# Patient Record
Sex: Female | Born: 1960 | Race: White | Hispanic: No | Marital: Married
Health system: Southern US, Community
[De-identification: ages and names within clinical notes are randomized; demographics above are authoritative.]

---

## 1997-12-12 ENCOUNTER — Ambulatory Visit (HOSPITAL_COMMUNITY): Admission: RE | Admit: 1997-12-12 | Discharge: 1997-12-12 | Payer: Self-pay | Admitting: Obstetrics & Gynecology

## 1997-12-14 ENCOUNTER — Ambulatory Visit (HOSPITAL_COMMUNITY): Admission: RE | Admit: 1997-12-14 | Discharge: 1997-12-14 | Payer: Self-pay | Admitting: Obstetrics & Gynecology

## 1998-05-08 ENCOUNTER — Encounter: Admission: RE | Admit: 1998-05-08 | Discharge: 1998-05-08 | Payer: Self-pay | Admitting: *Deleted

## 1999-03-20 ENCOUNTER — Other Ambulatory Visit: Admission: RE | Admit: 1999-03-20 | Discharge: 1999-03-20 | Payer: Self-pay | Admitting: Obstetrics & Gynecology

## 2000-11-12 ENCOUNTER — Other Ambulatory Visit: Admission: RE | Admit: 2000-11-12 | Discharge: 2000-11-12 | Payer: Self-pay | Admitting: Obstetrics & Gynecology

## 2000-11-19 ENCOUNTER — Encounter: Payer: Self-pay | Admitting: Obstetrics & Gynecology

## 2000-11-19 ENCOUNTER — Encounter: Admission: RE | Admit: 2000-11-19 | Discharge: 2000-11-19 | Payer: Self-pay | Admitting: Obstetrics & Gynecology

## 2014-05-11 ENCOUNTER — Institutional Professional Consult (permissible substitution): Payer: Self-pay | Admitting: Cardiology

## 2018-01-01 ENCOUNTER — Inpatient Hospital Stay: Payer: Self-pay

## 2018-01-01 ENCOUNTER — Inpatient Hospital Stay (HOSPITAL_COMMUNITY)
Admission: EM | Admit: 2018-01-01 | Discharge: 2018-01-27 | DRG: 082 | Disposition: E | Payer: BC Managed Care – PPO | Attending: General Surgery | Admitting: General Surgery

## 2018-01-01 ENCOUNTER — Inpatient Hospital Stay (HOSPITAL_COMMUNITY): Payer: BC Managed Care – PPO

## 2018-01-01 ENCOUNTER — Emergency Department (HOSPITAL_COMMUNITY): Payer: BC Managed Care – PPO

## 2018-01-01 ENCOUNTER — Other Ambulatory Visit: Payer: Self-pay

## 2018-01-01 DIAGNOSIS — D649 Anemia, unspecified: Secondary | ICD-10-CM | POA: Diagnosis present

## 2018-01-01 DIAGNOSIS — G935 Compression of brain: Secondary | ICD-10-CM | POA: Diagnosis present

## 2018-01-01 DIAGNOSIS — R001 Bradycardia, unspecified: Secondary | ICD-10-CM | POA: Diagnosis present

## 2018-01-01 DIAGNOSIS — J9601 Acute respiratory failure with hypoxia: Secondary | ICD-10-CM | POA: Diagnosis present

## 2018-01-01 DIAGNOSIS — E876 Hypokalemia: Secondary | ICD-10-CM | POA: Diagnosis present

## 2018-01-01 DIAGNOSIS — G9382 Brain death: Secondary | ICD-10-CM | POA: Diagnosis not present

## 2018-01-01 DIAGNOSIS — S0219XA Other fracture of base of skull, initial encounter for closed fracture: Secondary | ICD-10-CM | POA: Diagnosis present

## 2018-01-01 DIAGNOSIS — R402323 Coma scale, best motor response, extension, at hospital admission: Secondary | ICD-10-CM | POA: Diagnosis present

## 2018-01-01 DIAGNOSIS — R402213 Coma scale, best verbal response, none, at hospital admission: Secondary | ICD-10-CM | POA: Diagnosis present

## 2018-01-01 DIAGNOSIS — S065X9A Traumatic subdural hemorrhage with loss of consciousness of unspecified duration, initial encounter: Secondary | ICD-10-CM | POA: Diagnosis present

## 2018-01-01 DIAGNOSIS — I609 Nontraumatic subarachnoid hemorrhage, unspecified: Secondary | ICD-10-CM

## 2018-01-01 DIAGNOSIS — Z66 Do not resuscitate: Secondary | ICD-10-CM

## 2018-01-01 DIAGNOSIS — S065XAA Traumatic subdural hemorrhage with loss of consciousness status unknown, initial encounter: Secondary | ICD-10-CM | POA: Diagnosis present

## 2018-01-01 DIAGNOSIS — S064XAA Epidural hemorrhage with loss of consciousness status unknown, initial encounter: Secondary | ICD-10-CM

## 2018-01-01 DIAGNOSIS — R579 Shock, unspecified: Secondary | ICD-10-CM | POA: Diagnosis present

## 2018-01-01 DIAGNOSIS — R402113 Coma scale, eyes open, never, at hospital admission: Secondary | ICD-10-CM | POA: Diagnosis present

## 2018-01-01 DIAGNOSIS — S064X9A Epidural hemorrhage with loss of consciousness of unspecified duration, initial encounter: Secondary | ICD-10-CM

## 2018-01-01 DIAGNOSIS — Z515 Encounter for palliative care: Secondary | ICD-10-CM | POA: Diagnosis present

## 2018-01-01 DIAGNOSIS — T68XXXA Hypothermia, initial encounter: Secondary | ICD-10-CM | POA: Diagnosis present

## 2018-01-01 DIAGNOSIS — F1020 Alcohol dependence, uncomplicated: Secondary | ICD-10-CM | POA: Diagnosis present

## 2018-01-01 LAB — BASIC METABOLIC PANEL
Anion gap: 15 (ref 5–15)
BUN: 6 mg/dL (ref 6–20)
CO2: 20 mmol/L — ABNORMAL LOW (ref 22–32)
Calcium: 7.8 mg/dL — ABNORMAL LOW (ref 8.9–10.3)
Chloride: 111 mmol/L (ref 101–111)
Creatinine, Ser: 0.72 mg/dL (ref 0.44–1.00)
GFR calc Af Amer: >60 mL/min (ref >60)
GFR calc non Af Amer: >60 mL/min (ref >60)
Glucose, Bld: 114 mg/dL — ABNORMAL HIGH (ref 65–99)
POTASSIUM: 3.6 mmol/L (ref 3.5–5.1)
SODIUM: 146 mmol/L — AB (ref 135–145)

## 2018-01-01 LAB — I-STAT ARTERIAL BLOOD GAS, ED
Acid-base deficit: 20 mmol/L — ABNORMAL HIGH (ref 0.0–2.0)
Bicarbonate: 9.1 mmol/L — ABNORMAL LOW (ref 20.0–28.0)
O2 Saturation: 96 %
PCO2 ART: 29.5 mmHg — AB (ref 32.0–48.0)
PH ART: 7.083 — AB (ref 7.350–7.450)
Patient temperature: 95
TCO2: 10 mmol/L — ABNORMAL LOW (ref 22–32)
pO2, Arterial: 105 mmHg (ref 83.0–108.0)

## 2018-01-01 LAB — COMPREHENSIVE METABOLIC PANEL
ALT: 27 U/L (ref 14–54)
ANION GAP: 17 — AB (ref 5–15)
AST: 46 U/L — AB (ref 15–41)
Albumin: 4.1 g/dL (ref 3.5–5.0)
Alkaline Phosphatase: 94 U/L (ref 38–126)
BUN: 11 mg/dL (ref 6–20)
CHLORIDE: 110 mmol/L (ref 101–111)
CO2: 11 mmol/L — ABNORMAL LOW (ref 22–32)
Calcium: 8.8 mg/dL — ABNORMAL LOW (ref 8.9–10.3)
Creatinine, Ser: 0.58 mg/dL (ref 0.44–1.00)
GFR calc Af Amer: >60 mL/min (ref >60)
Glucose, Bld: 266 mg/dL — ABNORMAL HIGH (ref 65–99)
POTASSIUM: 2.7 mmol/L — AB (ref 3.5–5.1)
Sodium: 138 mmol/L (ref 135–145)
Total Bilirubin: 1.1 mg/dL (ref 0.3–1.2)
Total Protein: 6.7 g/dL (ref 6.5–8.1)

## 2018-01-01 LAB — TRIGLYCERIDES: Triglycerides: 89 mg/dL (ref <150)

## 2018-01-01 LAB — CBC
HEMATOCRIT: 43.2 % (ref 36.0–46.0)
HEMOGLOBIN: 14.6 g/dL (ref 12.0–15.0)
MCH: 32.9 pg (ref 26.0–34.0)
MCHC: 33.8 g/dL (ref 30.0–36.0)
MCV: 97.3 fL (ref 78.0–100.0)
Platelets: 188 10*3/uL (ref 150–400)
RBC: 4.44 MIL/uL (ref 3.87–5.11)
RDW: 13 % (ref 11.5–15.5)
WBC: 22.9 10*3/uL — AB (ref 4.0–10.5)

## 2018-01-01 LAB — ETHANOL: Alcohol, Ethyl (B): 84 mg/dL — ABNORMAL HIGH (ref <10)

## 2018-01-01 LAB — I-STAT CHEM 8, ED
BUN: 10 mg/dL (ref 6–20)
CREATININE: 0.6 mg/dL (ref 0.44–1.00)
Calcium, Ion: 1.08 mmol/L — ABNORMAL LOW (ref 1.15–1.40)
Chloride: 112 mmol/L — ABNORMAL HIGH (ref 101–111)
Glucose, Bld: 280 mg/dL — ABNORMAL HIGH (ref 65–99)
HEMATOCRIT: 44 % (ref 36.0–46.0)
HEMOGLOBIN: 15 g/dL (ref 12.0–15.0)
Potassium: 2.7 mmol/L — CL (ref 3.5–5.1)
Sodium: 142 mmol/L (ref 135–145)
TCO2: 14 mmol/L — AB (ref 22–32)

## 2018-01-01 LAB — URINALYSIS, ROUTINE W REFLEX MICROSCOPIC
Bilirubin Urine: NEGATIVE
Ketones, ur: 20 mg/dL — AB
LEUKOCYTES UA: NEGATIVE
NITRITE: NEGATIVE
Protein, ur: 100 mg/dL — AB
SPECIFIC GRAVITY, URINE: 1.018 (ref 1.005–1.030)
pH: 5 (ref 5.0–8.0)

## 2018-01-01 LAB — SODIUM
Sodium: 143 mmol/L (ref 135–145)
Sodium: 146 mmol/L — ABNORMAL HIGH (ref 135–145)

## 2018-01-01 LAB — I-STAT CG4 LACTIC ACID, ED: Lactic Acid, Venous: 4.43 mmol/L (ref 0.5–1.9)

## 2018-01-01 LAB — MAGNESIUM: MAGNESIUM: 1.9 mg/dL (ref 1.7–2.4)

## 2018-01-01 LAB — SAMPLE TO BLOOD BANK

## 2018-01-01 LAB — PROTIME-INR
INR: 1.45
PROTHROMBIN TIME: 17.6 s — AB (ref 11.4–15.2)

## 2018-01-01 LAB — MRSA PCR SCREENING: MRSA BY PCR: NEGATIVE

## 2018-01-01 MED ORDER — SODIUM CHLORIDE 0.9% FLUSH: 10.0000 mL | INTRAVENOUS | Status: DC | PRN

## 2018-01-01 MED ORDER — SODIUM CHLORIDE 0.9 % IV BOLUS
1000.0000 mL | Freq: Once | INTRAVENOUS | Status: AC
  Administered 2018-01-01: 1000 mL via INTRAVENOUS

## 2018-01-01 MED ORDER — SODIUM CHLORIDE 0.9% FLUSH: 10.0000 mL | Freq: Two times a day (BID) | INTRAVENOUS | Status: DC

## 2018-01-01 MED ORDER — SODIUM CHLORIDE 0.9 % IV SOLN
0.0000 ug/min | INTRAVENOUS | Status: DC
  Administered 2018-01-01 (×3): 120 ug/min via INTRAVENOUS
  Administered 2018-01-01: 100 ug/min via INTRAVENOUS
  Administered 2018-01-01: 20 ug/min via INTRAVENOUS
  Administered 2018-01-01: 120 ug/min via INTRAVENOUS
  Administered 2018-01-01: 200 ug/min via INTRAVENOUS
  Administered 2018-01-02 (×6): 75 ug/min via INTRAVENOUS
  Administered 2018-01-02: 65 ug/min via INTRAVENOUS
  Administered 2018-01-02: 75 ug/min via INTRAVENOUS
  Filled 2018-01-01: qty 1
  Filled 2018-01-01 (×3): qty 10
  Filled 2018-01-01: qty 1
  Filled 2018-01-01: qty 10
  Filled 2018-01-01: qty 1
  Filled 2018-01-01: qty 10
  Filled 2018-01-01: qty 1
  Filled 2018-01-01 (×2): qty 10
  Filled 2018-01-01: qty 1
  Filled 2018-01-01: qty 10
  Filled 2018-01-01: qty 1

## 2018-01-01 MED ORDER — CHLORHEXIDINE GLUCONATE CLOTH 2 % EX PADS: 6.0000 | MEDICATED_PAD | Freq: Every day | CUTANEOUS | Status: DC

## 2018-01-01 MED ORDER — ALBUMIN HUMAN 5 % IV SOLN
25.0000 g | Freq: Once | INTRAVENOUS | Status: AC
  Administered 2018-01-01: 25 g via INTRAVENOUS
  Filled 2018-01-01: qty 500

## 2018-01-01 MED ORDER — PROPOFOL 1000 MG/100ML IV EMUL: 0.0000 ug/kg/min | INTRAVENOUS | Status: DC

## 2018-01-01 MED ORDER — DOPAMINE-DEXTROSE 3.2-5 MG/ML-% IV SOLN
0.0000 ug/kg/min | INTRAVENOUS | Status: DC
  Administered 2018-01-01: 5 ug/kg/min via INTRAVENOUS
  Filled 2018-01-01: qty 250

## 2018-01-01 MED ORDER — ONDANSETRON 4 MG PO TBDP: 4.0000 mg | ORAL_TABLET | Freq: Four times a day (QID) | ORAL | Status: DC | PRN

## 2018-01-01 MED ORDER — MAGNESIUM SULFATE 2 GM/50ML IV SOLN
2.0000 g | Freq: Once | INTRAVENOUS | Status: AC
  Administered 2018-01-01: 2 g via INTRAVENOUS
  Filled 2018-01-01: qty 50

## 2018-01-01 MED ORDER — SODIUM CHLORIDE 0.9% FLUSH
10.0000 mL | Freq: Two times a day (BID) | INTRAVENOUS | Status: DC
  Administered 2018-01-02: 10 mL

## 2018-01-01 MED ORDER — PANTOPRAZOLE SODIUM 40 MG PO TBEC: 40.0000 mg | DELAYED_RELEASE_TABLET | Freq: Every day | ORAL | Status: DC

## 2018-01-01 MED ORDER — SODIUM CHLORIDE 0.9 % IV BOLUS
500.0000 mL | Freq: Once | INTRAVENOUS | Status: AC
  Administered 2018-01-01: 500 mL via INTRAVENOUS

## 2018-01-01 MED ORDER — PANTOPRAZOLE SODIUM 40 MG IV SOLR
40.0000 mg | Freq: Every day | INTRAVENOUS | Status: DC
  Administered 2018-01-01 – 2018-01-02 (×2): 40 mg via INTRAVENOUS
  Filled 2018-01-01 (×2): qty 40

## 2018-01-01 MED ORDER — FENTANYL BOLUS VIA INFUSION
50.0000 ug | INTRAVENOUS | Status: DC | PRN
  Filled 2018-01-01: qty 50

## 2018-01-01 MED ORDER — POTASSIUM CHLORIDE 10 MEQ/100ML IV SOLN
10.0000 meq | Freq: Once | INTRAVENOUS | Status: AC
  Administered 2018-01-01: 10 meq via INTRAVENOUS
  Filled 2018-01-01: qty 100

## 2018-01-01 MED ORDER — ONDANSETRON HCL 4 MG/2ML IJ SOLN: 4.0000 mg | Freq: Four times a day (QID) | INTRAMUSCULAR | Status: DC | PRN

## 2018-01-01 MED ORDER — SODIUM CHLORIDE 3 % IV SOLN
INTRAVENOUS | Status: DC
Start: 2018-01-01 — End: 2018-01-02
  Administered 2018-01-01 – 2018-01-02 (×3): 50 mL/h via INTRAVENOUS
  Filled 2018-01-01 (×5): qty 500

## 2018-01-01 MED ORDER — FENTANYL 2500MCG IN NS 250ML (10MCG/ML) PREMIX INFUSION: 25.0000 ug/h | INTRAVENOUS | Status: DC

## 2018-01-01 MED ORDER — MANNITOL 25 % IV SOLN
25.0000 g | Freq: Once | INTRAVENOUS | Status: AC
  Administered 2018-01-01: 25 g via INTRAVENOUS
  Filled 2018-01-01: qty 100

## 2018-01-01 MED ORDER — POTASSIUM CHLORIDE IN NACL 20-0.9 MEQ/L-% IV SOLN
INTRAVENOUS | Status: DC
  Filled 2018-01-01: qty 1000

## 2018-01-01 MED ORDER — ALBUMIN HUMAN 5 % IV SOLN
INTRAVENOUS | Status: AC
  Administered 2018-01-01: 19:00:00
  Administered 2018-01-01: 12.5 g
  Filled 2018-01-01: qty 500

## 2018-01-01 MED ORDER — ORAL CARE MOUTH RINSE
15.0000 mL | OROMUCOSAL | Status: DC
  Administered 2018-01-02 (×9): 15 mL via OROMUCOSAL

## 2018-01-01 MED ORDER — ALBUMIN HUMAN 5 % IV SOLN
25.0000 g | Freq: Once | INTRAVENOUS | Status: AC
  Administered 2018-01-01: 25 g via INTRAVENOUS

## 2018-01-01 MED ORDER — ESMOLOL HCL-SODIUM CHLORIDE 2000 MG/100ML IV SOLN
25.0000 ug/kg/min | INTRAVENOUS | Status: DC
  Administered 2018-01-01: 25 ug/kg/min via INTRAVENOUS
  Filled 2018-01-01: qty 100

## 2018-01-01 MED ORDER — CHLORHEXIDINE GLUCONATE 0.12% ORAL RINSE (MEDLINE KIT)
15.0000 mL | Freq: Two times a day (BID) | OROMUCOSAL | Status: DC
  Administered 2018-01-02 (×2): 15 mL via OROMUCOSAL

## 2018-01-01 MED ORDER — FENTANYL CITRATE (PF) 100 MCG/2ML IJ SOLN: 50.0000 ug | Freq: Once | INTRAMUSCULAR | Status: DC

## 2018-01-01 NOTE — Progress Notes (Signed)
At bedside to place PICC but per Florentina AddisonKatie, RN patient was no longer stable, PICC should be placed in am if still needed.  Informed Katie, RN that we would check again later to see if PICC was still on hold.  Ashlee LloydKerry Sahira Cataldi, RN VAST

## 2018-01-01 NOTE — Progress Notes (Signed)
   01/16/2018 1152  Clinical Encounter Type  Visited With Family;Patient;Health care provider  Visit Type ED;Critical Care  Referral From Nurse  Consult/Referral To Chaplain  Spiritual Encounters  Spiritual Needs Emotional;Prayer  Stress Factors  Family Stress Factors Major life changes   Was rounding in the ED when I received a page about this patient.  Patient is a teacher at a local elementary school and co-workers and neighbor were in the consult room.  Family had been contacted and were on the way from [place].  Supported the medical staff with family and sat with the friends, unable to update them until family arrived.  Patient moved to 4N and I met family and worked with staff to facilitate a family meeting with Dr. Thompson.  Other family arrived.  Supported the family at bedside and went with the patient's brother as he let the teachers know a little of what is going on. Prayed with the family and patient and supported the friends in the waiting room. Will pass on to Chaplain Green who is the chaplain for the floor.  Will follow and support as needed.   Chaplain Newton Cowan 

## 2018-01-01 NOTE — Procedures (Addendum)
FAST  Pre-procedure diagnosis:TBI Post-procedure diagnosis:No significant free fluid in the abdomen, no pericardial effusion Procedure: FAST Surgeon: Violeta GelinasBurke Deran Barro, MD Procedure in detail: The patient's abdomen was imaged in 4 regions with the ultrasound. First, the right upper quadrant was imaged. No free fluid was seen between the right kidney and the liver in Morison's pouch. Next, the epigastrium was imaged. No significant pericardial effusion was seen. Next, the left upper quadrant was imaged. No free fluid was seen between the left kidney and the spleen. Finally, the bladder was imaged. No free fluid was seen next to the bladder in the pelvis. Impression: Negative           Violeta GelinasBurke Camela Wich, MD, MPH, FACS Trauma: [phone] General Surgery: [phone]

## 2018-01-01 NOTE — Progress Notes (Signed)
Patient ID: Ashlee Carter, female   DOB: Mar 20, 1961, 57 y.o.   MRN: [id number] I met with her mother and two brothers. We also called her son who lives in [place] by speaker phone. 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 Ronnald Ramp from neurosurgery did not feel any surgical intervention would help her. He feels this is a lethal injury. I discussed that with them. Her son is arranging a flight to come here. They did relate she is a Pharmacist, hospital at [employer], smokes cigarettes, and is a heavy drinker. They said she often goes outside early most mornings to start her car and allow it to warm up. She likely fell while doing this this AM.  Georganna Skeans, MD, MPH, FACS Trauma: [phone] General Surgery: [phone]

## 2018-01-01 NOTE — ED Provider Notes (Addendum)
[facility] EMERGENCY DEPARTMENT Provider Note   CSN: [id number] Arrival date & time: 2018-01-23  0825     History   Chief Complaint No chief complaint on file.   HPI Ashlee Carter is a 57 y.o. female.  HPI  Patient is a 57 year old female, with a chief complaint for the visit is that she is altered and obtunded, this information comes from the paramedics as the patient was found by neighbors unresponsive in her yard wearing only her underwear, the paramedics found her with a blood sugar of 240, she was GCS of less than 5 as she was responding somewhat to airway manipulation but was not talking, was not opening her eyes to painful stimuli, was not focal to pain, there was no posturing.  They did note that she had a blown pupil on the left, they intubated her in the ambulance on the way to the hospital.  Otherwise the patient has had no other findings other than an bradycardic arrhythmia.  According to neighbors the patient has a longtime history of alcohol abuse.  There was no witnessed injury.  They did note that she had a large boggy hematoma to the posterior occiput and immobilized her neck.  Level 5 caveat applies secondary to altered mental status and severity of illness  The patient was intubated in the ambulance prior to arrival with a 7 oh ET tube  No past medical history on file.  Patient Active Problem List   Diagnosis Date Noted  . Subdural hematoma (HCC) 01-23-2018    The histories are not reviewed yet. Please review them in the "History" navigator section and refresh this SmartLink.   OB History   None      Home Medications    Prior to Admission medications   Not on File    Family History No family history on file.  Social History Social History   Tobacco Use  . Smoking status: Not on file  Substance Use Topics  . Alcohol use: Not on file  . Drug use: Not on file     Allergies   Patient has no allergy information on  record.   Review of Systems Review of Systems  Unable to perform ROS: Patient unresponsive     Physical Exam Updated Vital Signs BP (!) 74/53   Pulse (!) 56   Resp 19   Ht 5\' 4"  (1.626 m)   SpO2 99%   Physical Exam  Constitutional:  Ill-appearing, toxic, unresponsive  HENT:  Large boggy hematoma to the right posterior occiput extending up to the crown of the head and over to the right temporal area  Eyes:  Left pupil is blown and nonreactive, right eye appears normal  Neck:  Immobilized  Cardiovascular:  Mild bradycardia with heart rates ranging from 50-70, slightly irregular, pulses palpable at the carotid and femoral arteries  Pulmonary/Chest:  The patient is mechanically ventilated, she is not sedated, there is no spontaneous respirations  Abdominal:  Soft nontender abdomen  Musculoskeletal:  Full range of motion of all 4 extremities, the patient is flaccid bilaterally  Neurological:  Obtunded, GCS of 3, blown pupil  Skin:  Cold to the touch     ED Treatments / Results  Labs (all labs ordered are listed, but only abnormal results are displayed) Labs Reviewed  CBC - Abnormal; Notable for the following components:      Result Value   WBC 22.9 (*)    All other components within normal limits  ETHANOL - Abnormal; Notable for the following components:   Alcohol, Ethyl (B) 84 (*)    All other components within normal limits  PROTIME-INR - Abnormal; Notable for the following components:   Prothrombin Time 17.6 (*)    All other components within normal limits  I-STAT CHEM 8, ED - Abnormal; Notable for the following components:   Potassium 2.7 (*)    Chloride 112 (*)    Glucose, Bld 280 (*)    Calcium, Ion 1.08 (*)    TCO2 14 (*)    All other components within normal limits  I-STAT CG4 LACTIC ACID, ED - Abnormal; Notable for the following components:   Lactic Acid, Venous 4.43 (*)    All other components within normal limits  COMPREHENSIVE METABOLIC PANEL    URINALYSIS, ROUTINE W REFLEX MICROSCOPIC  CDS SEROLOGY  SAMPLE TO BLOOD BANK    EKG EKG Interpretation  Date/Time:  Friday 2018-01-08 08:26:19 EDT Ventricular Rate:  70 PR Interval:    QRS Duration: 194 QT Interval:  607 QTC Calculation: 656 R Axis:   93 Text Interpretation:  Atrial fibrillation Nonspecific intraventricular conduction delay Minimal ST depression, inferior leads Baseline wander in lead(s) V3 No old tracing to compare Non-specific intra-ventricular conduction delay Abnormal ekg Confirmed by Eber Hong ([id number]) on 01/08/18 10:40:40 AM   Radiology Ct Head Wo Contrast  Result Date: January 08, 2018 CLINICAL DATA:  Unwitnessed fall EXAM: CT HEAD WITHOUT CONTRAST CT CERVICAL SPINE WITHOUT CONTRAST TECHNIQUE: Multidetector CT imaging of the head and cervical spine was performed following the standard protocol without intravenous contrast. Multiplanar CT image reconstructions of the cervical spine were also generated. COMPARISON:  None. FINDINGS: CT HEAD FINDINGS Brain: There is a left-sided subdural hematoma extending throughout the frontal, temporal, and parietal regions as well as into the left tentorium. The maximum thickness of this subdural hematoma on the left is 1.0 cm. There is a minimal right-sided subdural hematoma in the left frontal region with a maximum thickness of 3 mm. There is subarachnoid hemorrhage in both frontal lobes with moderate surrounding edema. There is subdural hematoma extending along the falx. There is midline shift of the lateral ventricles toward the right by a distance of 8 mm. Note that there is effacement of the third and fourth ventricles. There is a focal intraparenchymal hemorrhage in the posterior right cerebellum with surrounding edema. Note that there is effacement of the temporal horn of the left lateral ventricle with edema in much of the left temporal lobe. There is subarachnoid hemorrhage in the suprasellar cistern as well. There is a minimal  epidural hematoma at the pons-cerebellum junction on the right near a temporal bone fracture. There is no significant mass effect from this small epidural hematoma. Vascular: No hyperdense vessels are evident. No vascular calcification evident. Skull: There is a linear fracture through the right occipital bone extending to the right parieto-occipital junction. No other fracture. No displaced fracture evident. There is scalp hematoma throughout the right temporal, right parietal, and portions of right occipital regions. There is also a fracture of the right temporal bone at the level of the middle ear with fracture extending into the middle ear. There is debris in the right external auditory canal. Sinuses/Orbits: There is opacification with air-fluid levels in both sphenoid sinuses. There is opacification of several ethmoid air cells. Orbits appear symmetric bilaterally. Other: There is opacification in several mastoid air cells bilaterally. CT CERVICAL SPINE FINDINGS Alignment: There is no spondylolisthesis. Skull base and vertebrae: Skull base  and craniocervical junction regions appear normal. No cervical spine fracture seen. There is an occipital bone fracture on the right described under the head CT report. No blastic or lytic bone lesions. Soft tissues and spinal canal: Prevertebral soft tissues and predental space regions are normal. No paraspinous lesions. No cord or canal hematoma evident. Disc levels: No appreciable disc space narrowing. No disc extrusion or stenosis. Mild facet hypertrophy at multiple levels. Upper chest: Visualized upper lung zones are clear. Other: Patient intubated. IMPRESSION: CT head: 1. Extensive subdural hematoma throughout the left supratentorial region extending into the left tentorium causing mass effect and midline shift at the level of the lateral ventricles of 8 mm. There is falcine subdural hematoma as well as a small right-sided subdural hematoma, not causing appreciable  mass effect. 2. There is uncal herniation with effacement of the third and fourth ventricles. 3. Small epidural hematoma at the level of a temporal bone fracture at the right pons-cerebellar junction, not causing significant mass effect. 4. Areas of subarachnoid hemorrhage in both frontal lobes as well as in the region of the suprasellar cistern. 5. Right temporal bone fracture at the level of the right middle ear. Occipital bone fracture with extensive soft tissue swelling/scalp hematoma throughout the right temporal, parietal, occipital regions. 6. Multifocal paranasal sinus and mastoid opacification, likely hemorrhage in this circumstance. CT cervical spine: No cervical spine fracture or spondylolisthesis. No appreciable disc space narrowing. No disc extrusion or stenosis. Critical Value/emergent results were called by telephone at the time of interpretation on 12/28/2017 at 9:13 am to Dr. Eber HongBRIAN Tecumseh Yeagley , who verbally acknowledged these results. Electronically Signed   By: Bretta BangWilliam  Woodruff III M.D.   On: 11-03-17 09:22   Ct Cervical Spine Wo Contrast  Result Date: 01/17/2018 CLINICAL DATA:  Unwitnessed fall EXAM: CT HEAD WITHOUT CONTRAST CT CERVICAL SPINE WITHOUT CONTRAST TECHNIQUE: Multidetector CT imaging of the head and cervical spine was performed following the standard protocol without intravenous contrast. Multiplanar CT image reconstructions of the cervical spine were also generated. COMPARISON:  None. FINDINGS: CT HEAD FINDINGS Brain: There is a left-sided subdural hematoma extending throughout the frontal, temporal, and parietal regions as well as into the left tentorium. The maximum thickness of this subdural hematoma on the left is 1.0 cm. There is a minimal right-sided subdural hematoma in the left frontal region with a maximum thickness of 3 mm. There is subarachnoid hemorrhage in both frontal lobes with moderate surrounding edema. There is subdural hematoma extending along the falx. There is  midline shift of the lateral ventricles toward the right by a distance of 8 mm. Note that there is effacement of the third and fourth ventricles. There is a focal intraparenchymal hemorrhage in the posterior right cerebellum with surrounding edema. Note that there is effacement of the temporal horn of the left lateral ventricle with edema in much of the left temporal lobe. There is subarachnoid hemorrhage in the suprasellar cistern as well. There is a minimal epidural hematoma at the pons-cerebellum junction on the right near a temporal bone fracture. There is no significant mass effect from this small epidural hematoma. Vascular: No hyperdense vessels are evident. No vascular calcification evident. Skull: There is a linear fracture through the right occipital bone extending to the right parieto-occipital junction. No other fracture. No displaced fracture evident. There is scalp hematoma throughout the right temporal, right parietal, and portions of right occipital regions. There is also a fracture of the right temporal bone at the level of the middle  ear with fracture extending into the middle ear. There is debris in the right external auditory canal. Sinuses/Orbits: There is opacification with air-fluid levels in both sphenoid sinuses. There is opacification of several ethmoid air cells. Orbits appear symmetric bilaterally. Other: There is opacification in several mastoid air cells bilaterally. CT CERVICAL SPINE FINDINGS Alignment: There is no spondylolisthesis. Skull base and vertebrae: Skull base and craniocervical junction regions appear normal. No cervical spine fracture seen. There is an occipital bone fracture on the right described under the head CT report. No blastic or lytic bone lesions. Soft tissues and spinal canal: Prevertebral soft tissues and predental space regions are normal. No paraspinous lesions. No cord or canal hematoma evident. Disc levels: No appreciable disc space narrowing. No disc  extrusion or stenosis. Mild facet hypertrophy at multiple levels. Upper chest: Visualized upper lung zones are clear. Other: Patient intubated. IMPRESSION: CT head: 1. Extensive subdural hematoma throughout the left supratentorial region extending into the left tentorium causing mass effect and midline shift at the level of the lateral ventricles of 8 mm. There is falcine subdural hematoma as well as a small right-sided subdural hematoma, not causing appreciable mass effect. 2. There is uncal herniation with effacement of the third and fourth ventricles. 3. Small epidural hematoma at the level of a temporal bone fracture at the right pons-cerebellar junction, not causing significant mass effect. 4. Areas of subarachnoid hemorrhage in both frontal lobes as well as in the region of the suprasellar cistern. 5. Right temporal bone fracture at the level of the right middle ear. Occipital bone fracture with extensive soft tissue swelling/scalp hematoma throughout the right temporal, parietal, occipital regions. 6. Multifocal paranasal sinus and mastoid opacification, likely hemorrhage in this circumstance. CT cervical spine: No cervical spine fracture or spondylolisthesis. No appreciable disc space narrowing. No disc extrusion or stenosis. Critical Value/emergent results were called by telephone at the time of interpretation on 06-Jan-2018 at 9:13 am to Dr. Eber Hong , who verbally acknowledged these results. Electronically Signed   By: Bretta Bang III M.D.   On: Jan 06, 2018 09:22   Dg Chest Portable 1 View  Result Date: 01-06-2018 CLINICAL DATA:  Intubation.  Found unresponsive in yard. EXAM: PORTABLE CHEST 1 VIEW COMPARISON:  None. FINDINGS: Right mainstem intubation. 3 cm retraction would place the tip between the clavicular heads and carina. Masslike opacity over the right hilum measuring 3.3 cm. Symmetric lung aeration. No visible contusion, hemothorax, or pneumothorax. No visible fracture. Symmetric  aeration. These results were called by telephone at the time of interpretation on 01-06-18 at 9:09 am to Dr. Eber Hong , who verbally acknowledged these results. Dr. Hyacinth Meeker is already aware of the contemporaneous head CT findings positive for contusion, subdural, and subarachnoid blood with skull fracture and midline shift causing left uncal herniation. IMPRESSION: 1. Right mainstem intubation.  Suggest tube retraction by 3 cm. 2. Masslike opacity over the right hilum primarily concerning for malignancy. Recommend chest CT when clinically appropriate. Electronically Signed   By: Marnee Spring M.D.   On: 01-06-2018 09:11    Procedures .Critical Care Performed by: Eber Hong, MD Authorized by: Eber Hong, MD   Critical care provider statement:    Critical care time (minutes):  35   Critical care time was exclusive of:  Separately billable procedures and treating other patients and teaching time   Critical care was necessary to treat or prevent imminent or life-threatening deterioration of the following conditions:  Trauma and CNS failure or compromise  Critical care was time spent personally by me on the following activities:  Blood draw for specimens, development of treatment plan with patient or surrogate, discussions with consultants, evaluation of patient's response to treatment, examination of patient, obtaining history from patient or surrogate, ordering and performing treatments and interventions, ordering and review of laboratory studies, ordering and review of radiographic studies, pulse oximetry, re-evaluation of patient's condition and review of old charts   (including critical care time)  Medications Ordered in ED Medications  potassium chloride 10 mEq in 100 mL IVPB (10 mEq Intravenous New Bag/Given 01-17-2018 0910)  phenylephrine (NEO-SYNEPHRINE) 10 mg in sodium chloride 0.9 % 250 mL (0.04 mg/mL) infusion (has no administration in time range)  magnesium sulfate IVPB 2 g 50 mL  (0 g Intravenous Stopped 17-Jan-2018 0843)  sodium chloride 0.9 % bolus 1,000 mL (0 mLs Intravenous Stopped 01/17/18 0940)  mannitol 25 % injection 25 g (25 g Intravenous Given 01/17/2018 0920)     Initial Impression / Assessment and Plan / ED Course  I have reviewed the triage vital signs and the nursing notes.  Pertinent labs & imaging results that were available during my care of the patient were reviewed by me and considered in my medical decision making (see chart for details).     The patient has multiple abnormalities, she has:  1. Severe hypothermia with a temperature of 81.5, actively rewarming with bear hugger and IV fluids  2.  Brain injury, obvious scalp hematoma, possible depressed skull fracture, blown pupil suggestive of a underlying subdural hematoma or other brain injury with herniation.  3.  Respiratory insufficiency likely secondary to brain injury  4.  Hypokalemia - this and the hypothermia likely contributory - mag and K have been ordered.  It is unclear how long the patient has been down however she does appear to have a life-threatening injury and may not be salvageable however CT scan of the brain and cervical spine has been ordered, immobilization of the neck will be maintained, the patient is actively being rewarmed, her EKG is abnormal and shows a prolonged QT which could be suggestive of multiple underlying etiologies, magnesium will be given to prevent arrhythmia, will consult with trauma and potentially neurosurgery based on CT scan.  Airway has been secured, the endotracheal tube above the carina on x-ray, clinically the patient has bilateral breath sounds with ventilation.  I have looked at the chest x-ray and there does not appear to be any signs of aspiration or pulmonary injury or compromise.  Patient back from CT scan at 9 AM, I personally viewed the images, there appears to be subdural hematoma, intraparenchymal hemorrhage and subarachnoid hemorrhage with an  associated skull fracture.  Neurosurgery paged immediately, the patient has a poor prognosis given what I am seeing.  Labs show severe hypokalemia, lactic acidosis, alcohol level has been elevated, discussed findings of the CT report with the radiologist, endotracheal tube withdrawn by 3 cm  We will also discuss with trauma surgery.  The patient is being actively rewarmed, temperature Foley catheter will be placed  D/w Trauma surgery and Neurosurgery at 9:15 AM  The patient is critically ill, she is been seen by trauma surgery, neurosurgery and may require brain surgery.  She will need to go to the intensive care unit.  Family members have not yet arrived, they are on route to the hospital.  Final Clinical Impressions(s) / ED Diagnoses   Final diagnoses:  Closed fracture of temporal bone, initial encounter (HCC)  Hypothermia  associated with environmental change  SDH (subdural hematoma) (HCC)  Epidural hematoma (HCC)  SAH (subarachnoid hemorrhage) (HCC)  Hypokalemia  Brain herniation (HCC)     Eber Hong, MD Jan 28, 2018 [id number]    Eber Hong, MD 01/28/2018 1041

## 2018-01-01 NOTE — Progress Notes (Signed)
Dr. Lindie SpruceWyatt notified of pt with tachycardia in the 140s-150s and SBP in the 170s with no PRN orders to treat. Dopamine stopped at 1630. Orders received for an esmolol infusion. Order placed. Pharmacy notified to send ASAP. Niece at bedside. Updated POC. Will continue to monitor.

## 2018-01-01 NOTE — Consult Note (Signed)
Reason for Consult: brain injury Referring Physician: EDP  Nalani Andreen is an 57 y.o. female.   HPI:  58 year old patient presents to the ED today after her neighbor found her down in her yard and in her underwear. She has a history of alcoholism according to her neighbor. No family at bedside. She is hypotensive, bradycardic, and hypothermic. Intubated by EMS and left blown pupil. She does flicker to painful stimuli.   No past medical history on file.    Allergies not on file  Social History   Tobacco Use  . Smoking status: Not on file  Substance Use Topics  . Alcohol use: Not on file    No family history on file.   Review of Systems  Positive ROS: UTA  All other systems have been reviewed and were otherwise negative with the exception of those mentioned in the HPI and as above.  Objective: Vital signs in last 24 hours: Pulse Rate:  [44-75] 54 (04/05 0945) Resp:  [19-21] 21 (04/05 0945) BP: (69-83)/(36-68) 72/51 (04/05 0945) SpO2:  [95 %-100 %] 98 % (04/05 0945) FiO2 (%):  [40 %] 40 % (04/05 0900)  General Appearance: unresponsive Head: Normocephalic, without obvious abnormality, atraumatic Eyes: Left pupil fixed irregular and blown.  Throat: intubated Lungs:  respirations unlabored intubated Heart: bradycardic and irregular Extremities: Extremities normal, atraumatic, no cyanosis or edema Pulses: 2+ and symmetric all extremities Skin: Skin color, texture, turgor normal, no rashes or lesions  NEUROLOGIC:   Mental status: intubated and sedated Motor Exam - flicker to painful stimuli Sensory Exam - Reflexes: not tested Coordination - not tested Gait - not tested Balance - not tested Cranial Nerves: I: smell Not tested  II: visual acuity  OS: na   OD: na  II: visual fields   II: pupils Left fixed irregular and 5, right 2 and nonreactive  III,VII: ptosis None  III,IV,VI: extraocular muscles  Fixed  V: mastication   V: facial light touch sensation     V,VII: corneal reflex    VII: facial muscle function - upper    VII: facial muscle function - lower   VIII: hearing   IX: soft palate elevation    IX,X: gag reflex Present  XI: trapezius strength    XI: sternocleidomastoid strength   XI: neck flexion strength    XII: tongue strength      Data Review Lab Results  Component Value Date   WBC 22.9 (H) 01/26/2018   HGB 15.0 01/09/2018   HCT 44.0 01/03/2018   MCV 97.3 12/31/2017   PLT 188 01/16/2018   Lab Results  Component Value Date   NA 142 01/14/2018   K 2.7 (LL) 12/31/2017   CL 112 (H) 12/28/2017   CO2 11 (L) 12/30/2017   BUN 10 01/16/2018   CREATININE 0.60 01/23/2018   GLUCOSE 280 (H) 01/20/2018   Lab Results  Component Value Date   INR 1.45 01/17/2018    Radiology: Ct Head Wo Contrast  Result Date: 01/14/2018 CLINICAL DATA:  Unwitnessed fall EXAM: CT HEAD WITHOUT CONTRAST CT CERVICAL SPINE WITHOUT CONTRAST TECHNIQUE: Multidetector CT imaging of the head and cervical spine was performed following the standard protocol without intravenous contrast. Multiplanar CT image reconstructions of the cervical spine were also generated. COMPARISON:  None. FINDINGS: CT HEAD FINDINGS Brain: There is a left-sided subdural hematoma extending throughout the frontal, temporal, and parietal regions as well as into the left tentorium. The maximum thickness of this subdural hematoma on the left  is 1.0 cm. There is a minimal right-sided subdural hematoma in the left frontal region with a maximum thickness of 3 mm. There is subarachnoid hemorrhage in both frontal lobes with moderate surrounding edema. There is subdural hematoma extending along the falx. There is midline shift of the lateral ventricles toward the right by a distance of 8 mm. Note that there is effacement of the third and fourth ventricles. There is a focal intraparenchymal hemorrhage in the posterior right cerebellum with surrounding edema. Note that there is effacement of the  temporal horn of the left lateral ventricle with edema in much of the left temporal lobe. There is subarachnoid hemorrhage in the suprasellar cistern as well. There is a minimal epidural hematoma at the pons-cerebellum junction on the right near a temporal bone fracture. There is no significant mass effect from this small epidural hematoma. Vascular: No hyperdense vessels are evident. No vascular calcification evident. Skull: There is a linear fracture through the right occipital bone extending to the right parieto-occipital junction. No other fracture. No displaced fracture evident. There is scalp hematoma throughout the right temporal, right parietal, and portions of right occipital regions. There is also a fracture of the right temporal bone at the level of the middle ear with fracture extending into the middle ear. There is debris in the right external auditory canal. Sinuses/Orbits: There is opacification with air-fluid levels in both sphenoid sinuses. There is opacification of several ethmoid air cells. Orbits appear symmetric bilaterally. Other: There is opacification in several mastoid air cells bilaterally. CT CERVICAL SPINE FINDINGS Alignment: There is no spondylolisthesis. Skull base and vertebrae: Skull base and craniocervical junction regions appear normal. No cervical spine fracture seen. There is an occipital bone fracture on the right described under the head CT report. No blastic or lytic bone lesions. Soft tissues and spinal canal: Prevertebral soft tissues and predental space regions are normal. No paraspinous lesions. No cord or canal hematoma evident. Disc levels: No appreciable disc space narrowing. No disc extrusion or stenosis. Mild facet hypertrophy at multiple levels. Upper chest: Visualized upper lung zones are clear. Other: Patient intubated. IMPRESSION: CT head: 1. Extensive subdural hematoma throughout the left supratentorial region extending into the left tentorium causing mass effect  and midline shift at the level of the lateral ventricles of 8 mm. There is falcine subdural hematoma as well as a small right-sided subdural hematoma, not causing appreciable mass effect. 2. There is uncal herniation with effacement of the third and fourth ventricles. 3. Small epidural hematoma at the level of a temporal bone fracture at the right pons-cerebellar junction, not causing significant mass effect. 4. Areas of subarachnoid hemorrhage in both frontal lobes as well as in the region of the suprasellar cistern. 5. Right temporal bone fracture at the level of the right middle ear. Occipital bone fracture with extensive soft tissue swelling/scalp hematoma throughout the right temporal, parietal, occipital regions. 6. Multifocal paranasal sinus and mastoid opacification, likely hemorrhage in this circumstance. CT cervical spine: No cervical spine fracture or spondylolisthesis. No appreciable disc space narrowing. No disc extrusion or stenosis. Critical Value/emergent results were called by telephone at the time of interpretation on 01/13/2018 at 9:13 am to Dr. Eber HongBRIAN MILLER , who verbally acknowledged these results. Electronically Signed   By: Bretta BangWilliam  Woodruff III M.D.   On: 01/18/2018 09:22   Ct Cervical Spine Wo Contrast  Result Date: 01/05/2018 CLINICAL DATA:  Unwitnessed fall EXAM: CT HEAD WITHOUT CONTRAST CT CERVICAL SPINE WITHOUT CONTRAST TECHNIQUE: Multidetector CT imaging  of the head and cervical spine was performed following the standard protocol without intravenous contrast. Multiplanar CT image reconstructions of the cervical spine were also generated. COMPARISON:  None. FINDINGS: CT HEAD FINDINGS Brain: There is a left-sided subdural hematoma extending throughout the frontal, temporal, and parietal regions as well as into the left tentorium. The maximum thickness of this subdural hematoma on the left is 1.0 cm. There is a minimal right-sided subdural hematoma in the left frontal region with a  maximum thickness of 3 mm. There is subarachnoid hemorrhage in both frontal lobes with moderate surrounding edema. There is subdural hematoma extending along the falx. There is midline shift of the lateral ventricles toward the right by a distance of 8 mm. Note that there is effacement of the third and fourth ventricles. There is a focal intraparenchymal hemorrhage in the posterior right cerebellum with surrounding edema. Note that there is effacement of the temporal horn of the left lateral ventricle with edema in much of the left temporal lobe. There is subarachnoid hemorrhage in the suprasellar cistern as well. There is a minimal epidural hematoma at the pons-cerebellum junction on the right near a temporal bone fracture. There is no significant mass effect from this small epidural hematoma. Vascular: No hyperdense vessels are evident. No vascular calcification evident. Skull: There is a linear fracture through the right occipital bone extending to the right parieto-occipital junction. No other fracture. No displaced fracture evident. There is scalp hematoma throughout the right temporal, right parietal, and portions of right occipital regions. There is also a fracture of the right temporal bone at the level of the middle ear with fracture extending into the middle ear. There is debris in the right external auditory canal. Sinuses/Orbits: There is opacification with air-fluid levels in both sphenoid sinuses. There is opacification of several ethmoid air cells. Orbits appear symmetric bilaterally. Other: There is opacification in several mastoid air cells bilaterally. CT CERVICAL SPINE FINDINGS Alignment: There is no spondylolisthesis. Skull base and vertebrae: Skull base and craniocervical junction regions appear normal. No cervical spine fracture seen. There is an occipital bone fracture on the right described under the head CT report. No blastic or lytic bone lesions. Soft tissues and spinal canal: Prevertebral  soft tissues and predental space regions are normal. No paraspinous lesions. No cord or canal hematoma evident. Disc levels: No appreciable disc space narrowing. No disc extrusion or stenosis. Mild facet hypertrophy at multiple levels. Upper chest: Visualized upper lung zones are clear. Other: Patient intubated. IMPRESSION: CT head: 1. Extensive subdural hematoma throughout the left supratentorial region extending into the left tentorium causing mass effect and midline shift at the level of the lateral ventricles of 8 mm. There is falcine subdural hematoma as well as a small right-sided subdural hematoma, not causing appreciable mass effect. 2. There is uncal herniation with effacement of the third and fourth ventricles. 3. Small epidural hematoma at the level of a temporal bone fracture at the right pons-cerebellar junction, not causing significant mass effect. 4. Areas of subarachnoid hemorrhage in both frontal lobes as well as in the region of the suprasellar cistern. 5. Right temporal bone fracture at the level of the right middle ear. Occipital bone fracture with extensive soft tissue swelling/scalp hematoma throughout the right temporal, parietal, occipital regions. 6. Multifocal paranasal sinus and mastoid opacification, likely hemorrhage in this circumstance. CT cervical spine: No cervical spine fracture or spondylolisthesis. No appreciable disc space narrowing. No disc extrusion or stenosis. Critical Value/emergent results were called by telephone  at the time of interpretation on 01/13/2018 at 9:13 am to Dr. Eber Hong , who verbally acknowledged these results. Electronically Signed   By: Bretta Bang III M.D.   On: 12/31/2017 09:22   Dg Chest Portable 1 View  Result Date: 01/22/2018 CLINICAL DATA:  Intubation.  Found unresponsive in yard. EXAM: PORTABLE CHEST 1 VIEW COMPARISON:  None. FINDINGS: Right mainstem intubation. 3 cm retraction would place the tip between the clavicular heads and carina.  Masslike opacity over the right hilum measuring 3.3 cm. Symmetric lung aeration. No visible contusion, hemothorax, or pneumothorax. No visible fracture. Symmetric aeration. These results were called by telephone at the time of interpretation on 01/09/2018 at 9:09 am to Dr. Eber Hong , who verbally acknowledged these results. Dr. Hyacinth Meeker is already aware of the contemporaneous head CT findings positive for contusion, subdural, and subarachnoid blood with skull fracture and midline shift causing left uncal herniation. IMPRESSION: 1. Right mainstem intubation.  Suggest tube retraction by 3 cm. 2. Masslike opacity over the right hilum primarily concerning for malignancy. Recommend chest CT when clinically appropriate. Electronically Signed   By: Marnee Spring M.D.   On: 01/23/2018 09:11     Assessment/Plan: 57 year old female presents to the ED today after being found down by her neighbor. No known medical history other than alcoholism. CT Scan shows severe brainstem compression and suggests hypodensity of the basal brain and brainstem. Left sdh is not overly thick and the amount of mass effect and shift is out of proportion to the size of the sdh. Therefore we do not think decompressive craniectomy and sdh evac over the dominate hemisphere will offer a significant improvement in outcome. I think the prognosis is very poor and I think surgical treatment would be futile. Cannot give mannitol as she is hypotensive but would treat with 3% saline. Could consider intracranial pressure monitor placement if coags are normal and she stabilizes.   Tiana Loft [facility] 01/03/2018 9:57 AM

## 2018-01-01 NOTE — Progress Notes (Signed)
Spoke with Darla, RN and PICC still on hold and needs to be done in the morning if still needed.  Patient currently has IV access.  Gasper LloydKerry Dresden Ament, RN VAST

## 2018-01-01 NOTE — Progress Notes (Signed)
Patient came in intubated by EMS, with a 7.0 ETT taped at 27 cm at lip, good color change on ETCO2 detector, capnography at 22, placed on above vent settings per [facility] net, MD aware.

## 2018-01-01 NOTE — ED Notes (Signed)
Attempted to place adult aspen cervical collar on pt, however pt's neck is too small for pt. Pediatric cervical collar placed on pt instead of adult sized collar.

## 2018-01-01 NOTE — Progress Notes (Signed)
ABG shown to RN  PH-7.24 CO2-26.7 O2- 176 HCO3- 12.5 SPO2 99%

## 2018-01-01 NOTE — H&P (Addendum)
Ashlee Carter is an 57 y.o. female.   Chief Complaint: unresponsive HPI: Ashlee Carter was found out in her yard by a neighbor. She was unresponsive and wearing only her underwear. EMS was called and on their arrival, she remained unresponsive. She was intubated in the field and transported to the ED. She was not a trauma alert. She was worked up in the ED and found to have electrolyte abnormalities which are being treated. CT head showed an extensive L SDH with uncal herniation. We are asked to admit. The neighbor reported she has a history of ETOH abuse. No other history is available.  No past medical history on file.   Past surgical HX unknown  No family history on file. Social History:  has no tobacco, alcohol, and drug history on file.  Allergies: Allergies not on file   (Not in a hospital admission)  Results for orders placed or performed during the hospital encounter of 08-Jan-2018 (from the past 48 hour(s))  Sample to Blood Bank     Status: None   Collection Time: 01/08/18  8:31 AM  Result Value Ref Range   Blood Bank Specimen SAMPLE AVAILABLE FOR TESTING    Sample Expiration      01/19/2018 Performed at [facility], [address] [place], [place] [address]   CBC     Status: Abnormal   Collection Time: 08-Jan-2018  8:36 AM  Result Value Ref Range   WBC 22.9 (H) 4.0 - 10.5 K/uL   RBC 4.44 3.87 - 5.11 MIL/uL   Hemoglobin 14.6 12.0 - 15.0 g/dL   HCT 60.4 54.0 - 98.1 %   MCV 97.3 78.0 - 100.0 fL   MCH 32.9 26.0 - 34.0 pg   MCHC 33.8 30.0 - 36.0 g/dL   RDW 19.1 47.8 - 29.5 %   Platelets 188 150 - 400 K/uL    Comment: Performed at [facility], [address] [place], [place] [address]  Protime-INR     Status: Abnormal   Collection Time: 08-Jan-2018  8:36 AM  Result Value Ref Range   Prothrombin Time 17.6 (H) 11.4 - 15.2 seconds   INR 1.45     Comment: Performed at [facility], [address] [place], [place] [address]  Ethanol     Status: Abnormal   Collection Time: January 08, 2018  8:37 AM  Result Value Ref Range   Alcohol, Ethyl (B) 84 (H) <10 mg/dL    Comment:        LOWEST DETECTABLE LIMIT FOR SERUM ALCOHOL IS 10 mg/dL FOR MEDICAL PURPOSES ONLY Performed at [facility], [address], [place], [place] [address]   I-Stat Chem 8, ED     Status: Abnormal   Collection Time: 2018/01/08  8:42 AM  Result Value Ref Range   Sodium 142 135 - 145 mmol/L   Potassium 2.7 (LL) 3.5 - 5.1 mmol/L   Chloride 112 (H) 101 - 111 mmol/L   BUN 10 6 - 20 mg/dL   Creatinine, Ser 9.52 0.44 - 1.00 mg/dL   Glucose, Bld 841 (H) 65 - 99 mg/dL   Calcium, Ion 3.24 (L) 1.15 - 1.40 mmol/L   TCO2 14 (L) 22 - 32 mmol/L   Hemoglobin 15.0 12.0 - 15.0 g/dL   HCT 40.1 02.7 - 25.3 %   Comment NOTIFIED PHYSICIAN   I-Stat CG4 Lactic Acid, ED     Status: Abnormal   Collection Time: 01-08-18  8:43 AM  Result Value Ref  Range   Lactic Acid, Venous 4.43 (HH) 0.5 - 1.9 mmol/L   Comment NOTIFIED PHYSICIAN   Urinalysis, Routine w reflex microscopic     Status: Abnormal   Collection Time: 01-29-2018  9:17 AM  Result Value Ref Range   Color, Urine YELLOW YELLOW   APPearance HAZY (A) CLEAR   Specific Gravity, Urine 1.018 1.005 - 1.030   pH 5.0 5.0 - 8.0   Glucose, UA >=500 (A) NEGATIVE mg/dL   Hgb urine dipstick MODERATE (A) NEGATIVE   Bilirubin Urine NEGATIVE NEGATIVE   Ketones, ur 20 (A) NEGATIVE mg/dL   Protein, ur 295 (A) NEGATIVE mg/dL   Nitrite NEGATIVE NEGATIVE   Leukocytes, UA NEGATIVE NEGATIVE   RBC / HPF 0-5 0 - 5 RBC/hpf   WBC, UA 0-5 0 - 5 WBC/hpf   Bacteria, UA RARE (A) NONE SEEN   Squamous Epithelial / LPF 0-5 (A) NONE SEEN   Mucus PRESENT    Hyaline Casts, UA PRESENT    Granular Casts, UA PRESENT    Amorphous Crystal PRESENT    Non Squamous Epithelial 0-5 (A) NONE SEEN    Comment: Performed at [facility], [address], [place], [place] [address]   Ct Head Wo Contrast  Result Date: Jan 29, 2018 CLINICAL DATA:  Unwitnessed fall EXAM: CT  HEAD WITHOUT CONTRAST CT CERVICAL SPINE WITHOUT CONTRAST TECHNIQUE: Multidetector CT imaging of the head and cervical spine was performed following the standard protocol without intravenous contrast. Multiplanar CT image reconstructions of the cervical spine were also generated. COMPARISON:  None. FINDINGS: CT HEAD FINDINGS Brain: There is a left-sided subdural hematoma extending throughout the frontal, temporal, and parietal regions as well as into the left tentorium. The maximum thickness of this subdural hematoma on the left is 1.0 cm. There is a minimal right-sided subdural hematoma in the left frontal region with a maximum thickness of 3 mm. There is subarachnoid hemorrhage in both frontal lobes with moderate surrounding edema. There is subdural hematoma extending along the falx. There is midline shift of the lateral ventricles toward the right by a distance of 8 mm. Note that there is effacement of the third and fourth ventricles. There is a focal intraparenchymal hemorrhage in the posterior right cerebellum with surrounding edema. Note that there is effacement of the temporal horn of the left lateral ventricle with edema in much of the left temporal lobe. There is subarachnoid hemorrhage in the suprasellar cistern as well. There is a minimal epidural hematoma at the pons-cerebellum junction on the right near a temporal bone fracture. There is no significant mass effect from this small epidural hematoma. Vascular: No hyperdense vessels are evident. No vascular calcification evident. Skull: There is a linear fracture through the right occipital bone extending to the right parieto-occipital junction. No other fracture. No displaced fracture evident. There is scalp hematoma throughout the right temporal, right parietal, and portions of right occipital regions. There is also a fracture of the right temporal bone at the level of the middle ear with fracture extending into the middle ear. There is debris in the  right external auditory canal. Sinuses/Orbits: There is opacification with air-fluid levels in both sphenoid sinuses. There is opacification of several ethmoid air cells. Orbits appear symmetric bilaterally. Other: There is opacification in several mastoid air cells bilaterally. CT CERVICAL SPINE FINDINGS Alignment: There is no spondylolisthesis. Skull base and vertebrae: Skull base and craniocervical junction regions appear normal. No cervical spine fracture seen. There is an occipital bone fracture on the  right described under the head CT report. No blastic or lytic bone lesions. Soft tissues and spinal canal: Prevertebral soft tissues and predental space regions are normal. No paraspinous lesions. No cord or canal hematoma evident. Disc levels: No appreciable disc space narrowing. No disc extrusion or stenosis. Mild facet hypertrophy at multiple levels. Upper chest: Visualized upper lung zones are clear. Other: Patient intubated. IMPRESSION: CT head: 1. Extensive subdural hematoma throughout the left supratentorial region extending into the left tentorium causing mass effect and midline shift at the level of the lateral ventricles of 8 mm. There is falcine subdural hematoma as well as a small right-sided subdural hematoma, not causing appreciable mass effect. 2. There is uncal herniation with effacement of the third and fourth ventricles. 3. Small epidural hematoma at the level of a temporal bone fracture at the right pons-cerebellar junction, not causing significant mass effect. 4. Areas of subarachnoid hemorrhage in both frontal lobes as well as in the region of the suprasellar cistern. 5. Right temporal bone fracture at the level of the right middle ear. Occipital bone fracture with extensive soft tissue swelling/scalp hematoma throughout the right temporal, parietal, occipital regions. 6. Multifocal paranasal sinus and mastoid opacification, likely hemorrhage in this circumstance. CT cervical spine: No  cervical spine fracture or spondylolisthesis. No appreciable disc space narrowing. No disc extrusion or stenosis. Critical Value/emergent results were called by telephone at the time of interpretation on 01/12/2018 at 9:13 am to Dr. Eber Hong , who verbally acknowledged these results. Electronically Signed   By: Bretta Bang III M.D.   On: 01/07/2018 09:22   Ct Cervical Spine Wo Contrast  Result Date: 12/30/2017 CLINICAL DATA:  Unwitnessed fall EXAM: CT HEAD WITHOUT CONTRAST CT CERVICAL SPINE WITHOUT CONTRAST TECHNIQUE: Multidetector CT imaging of the head and cervical spine was performed following the standard protocol without intravenous contrast. Multiplanar CT image reconstructions of the cervical spine were also generated. COMPARISON:  None. FINDINGS: CT HEAD FINDINGS Brain: There is a left-sided subdural hematoma extending throughout the frontal, temporal, and parietal regions as well as into the left tentorium. The maximum thickness of this subdural hematoma on the left is 1.0 cm. There is a minimal right-sided subdural hematoma in the left frontal region with a maximum thickness of 3 mm. There is subarachnoid hemorrhage in both frontal lobes with moderate surrounding edema. There is subdural hematoma extending along the falx. There is midline shift of the lateral ventricles toward the right by a distance of 8 mm. Note that there is effacement of the third and fourth ventricles. There is a focal intraparenchymal hemorrhage in the posterior right cerebellum with surrounding edema. Note that there is effacement of the temporal horn of the left lateral ventricle with edema in much of the left temporal lobe. There is subarachnoid hemorrhage in the suprasellar cistern as well. There is a minimal epidural hematoma at the pons-cerebellum junction on the right near a temporal bone fracture. There is no significant mass effect from this small epidural hematoma. Vascular: No hyperdense vessels are evident. No  vascular calcification evident. Skull: There is a linear fracture through the right occipital bone extending to the right parieto-occipital junction. No other fracture. No displaced fracture evident. There is scalp hematoma throughout the right temporal, right parietal, and portions of right occipital regions. There is also a fracture of the right temporal bone at the level of the middle ear with fracture extending into the middle ear. There is debris in the right external auditory canal. Sinuses/Orbits: There  is opacification with air-fluid levels in both sphenoid sinuses. There is opacification of several ethmoid air cells. Orbits appear symmetric bilaterally. Other: There is opacification in several mastoid air cells bilaterally. CT CERVICAL SPINE FINDINGS Alignment: There is no spondylolisthesis. Skull base and vertebrae: Skull base and craniocervical junction regions appear normal. No cervical spine fracture seen. There is an occipital bone fracture on the right described under the head CT report. No blastic or lytic bone lesions. Soft tissues and spinal canal: Prevertebral soft tissues and predental space regions are normal. No paraspinous lesions. No cord or canal hematoma evident. Disc levels: No appreciable disc space narrowing. No disc extrusion or stenosis. Mild facet hypertrophy at multiple levels. Upper chest: Visualized upper lung zones are clear. Other: Patient intubated. IMPRESSION: CT head: 1. Extensive subdural hematoma throughout the left supratentorial region extending into the left tentorium causing mass effect and midline shift at the level of the lateral ventricles of 8 mm. There is falcine subdural hematoma as well as a small right-sided subdural hematoma, not causing appreciable mass effect. 2. There is uncal herniation with effacement of the third and fourth ventricles. 3. Small epidural hematoma at the level of a temporal bone fracture at the right pons-cerebellar junction, not causing  significant mass effect. 4. Areas of subarachnoid hemorrhage in both frontal lobes as well as in the region of the suprasellar cistern. 5. Right temporal bone fracture at the level of the right middle ear. Occipital bone fracture with extensive soft tissue swelling/scalp hematoma throughout the right temporal, parietal, occipital regions. 6. Multifocal paranasal sinus and mastoid opacification, likely hemorrhage in this circumstance. CT cervical spine: No cervical spine fracture or spondylolisthesis. No appreciable disc space narrowing. No disc extrusion or stenosis. Critical Value/emergent results were called by telephone at the time of interpretation on 12/29/2017 at 9:13 am to Dr. Eber Hong , who verbally acknowledged these results. Electronically Signed   By: Bretta Bang III M.D.   On: 01/06/2018 09:22   Dg Chest Portable 1 View  Result Date: 01/25/2018 CLINICAL DATA:  Intubation.  Found unresponsive in yard. EXAM: PORTABLE CHEST 1 VIEW COMPARISON:  None. FINDINGS: Right mainstem intubation. 3 cm retraction would place the tip between the clavicular heads and carina. Masslike opacity over the right hilum measuring 3.3 cm. Symmetric lung aeration. No visible contusion, hemothorax, or pneumothorax. No visible fracture. Symmetric aeration. These results were called by telephone at the time of interpretation on 01/18/2018 at 9:09 am to Dr. Eber Hong , who verbally acknowledged these results. Dr. Hyacinth Meeker is already aware of the contemporaneous head CT findings positive for contusion, subdural, and subarachnoid blood with skull fracture and midline shift causing left uncal herniation. IMPRESSION: 1. Right mainstem intubation.  Suggest tube retraction by 3 cm. 2. Masslike opacity over the right hilum primarily concerning for malignancy. Recommend chest CT when clinically appropriate. Electronically Signed   By: Marnee Spring M.D.   On: 01/14/2018 09:11    Review of Systems  Unable to perform ROS:  Intubated    Blood pressure (!) 74/53, pulse (!) 56, resp. rate 19, height 5\' 4"  (1.626 m), SpO2 99 %. Physical Exam  Constitutional: She appears well-developed and well-nourished.  HENT:  Head: Head is without Battle's sign.    Right Ear: Tympanic membrane, external ear and ear canal normal.  Left Ear: Tympanic membrane, external ear and ear canal normal.  Nose: No nose lacerations.  Mouth/Throat: Uvula is midline.  Posterior scalp hematoma  Eyes:  R pupil 3mm fixed,  L pupil fixed and dilated  Neck: No tracheal deviation present.  collar  Cardiovascular: Normal rate, regular rhythm, normal heart sounds and intact distal pulses.  Respiratory: Effort normal and breath sounds normal. No respiratory distress. She has no wheezes. She has no rales.  GI: Soft. She exhibits no distension. There is no tenderness. There is no rebound and no guarding.  Musculoskeletal: She exhibits no edema or deformity.  Neurological: She displays no atrophy and no tremor. She exhibits normal muscle tone. She displays no seizure activity. GCS eye subscore is 1. GCS verbal subscore is 1. GCS motor subscore is 2.  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

## 2018-01-01 NOTE — ED Notes (Signed)
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  The principal for her school and a very close friend are in lobby.  Friend was able to give phone # for pt's brother who lives in [place]: Kathlen ModyDavid Gerill [phone] and is on his way with the patient's mother.  Friend was also able to give name and # for brother: Jesusita OkaDan [phone].

## 2018-01-01 NOTE — Progress Notes (Signed)
Peripherally Inserted Central Catheter/Midline Placement  The IV Nurse has discussed with the patient and/or persons authorized to consent for the patient, the purpose of this procedure and the potential benefits and risks involved with this procedure.  The benefits include less needle sticks, lab draws from the catheter, and the patient may be discharged home with the catheter. Risks include, but not limited to, infection, bleeding, blood clot (thrombus formation), and puncture of an artery; nerve damage and irregular heartbeat and possibility to perform a PICC exchange if needed/ordered by physician.  Alternatives to this procedure were also discussed.  Bard Power PICC patient education guide, fact sheet on infection prevention and patient information card has been provided to patient /or left at bedside.  Consent obtained via telephone from son due to altered mental status.  PICC/Midline Placement Documentation  PICC Triple Lumen 01/24/2018 PICC Right Basilic 38 cm 0 cm (Active)  Indication for Insertion or Continuance of Line Vasoactive infusions;Poor Vasculature-patient has had multiple peripheral attempts or PIVs lasting less than 24 hours 01/10/2018 10:26 PM  Exposed Catheter (cm) 0 cm 01/24/2018 10:26 PM  Site Assessment Clean;Dry;Intact 01/15/2018 10:26 PM  Lumen #1 Status Flushed;Saline locked;Blood return noted 01/12/2018 10:26 PM  Lumen #2 Status Flushed;Saline locked;Blood return noted 12/31/2017 10:26 PM  Lumen #3 Status Flushed;Saline locked;Blood return noted 01/22/2018 10:26 PM  Dressing Type Transparent 01/10/2018 10:26 PM  Dressing Status Clean;Dry;Intact;Antimicrobial disc in place 12/28/2017 10:26 PM  Dressing Change Due 01/08/18 01/23/2018 10:26 PM       Willaim Mode, Lajean ManesKerry Loraine 01/01/2018, 10:27 PM

## 2018-01-02 ENCOUNTER — Inpatient Hospital Stay (HOSPITAL_COMMUNITY): Payer: BC Managed Care – PPO

## 2018-01-02 LAB — CBC
HEMATOCRIT: 26.2 % — AB (ref 36.0–46.0)
HEMOGLOBIN: 8.8 g/dL — AB (ref 12.0–15.0)
MCH: 32.7 pg (ref 26.0–34.0)
MCHC: 33.6 g/dL (ref 30.0–36.0)
MCV: 97.4 fL (ref 78.0–100.0)
Platelets: 130 10*3/uL — ABNORMAL LOW (ref 150–400)
RBC: 2.69 MIL/uL — AB (ref 3.87–5.11)
RDW: 13.8 % (ref 11.5–15.5)
WBC: 10.8 10*3/uL — AB (ref 4.0–10.5)

## 2018-01-02 LAB — POCT I-STAT 3, ART BLOOD GAS (G3+)
ACID-BASE DEFICIT: 4 mmol/L — AB (ref 0.0–2.0)
BICARBONATE: 20.4 mmol/L (ref 20.0–28.0)
O2 SAT: 100 %
TCO2: 21 mmol/L — AB (ref 22–32)
pCO2 arterial: 33.7 mmHg (ref 32.0–48.0)
pH, Arterial: 7.388 (ref 7.350–7.450)
pO2, Arterial: 176 mmHg — ABNORMAL HIGH (ref 83.0–108.0)

## 2018-01-02 LAB — BASIC METABOLIC PANEL
ANION GAP: 8 (ref 5–15)
BUN: 5 mg/dL — ABNORMAL LOW (ref 6–20)
CHLORIDE: 124 mmol/L — AB (ref 101–111)
CO2: 20 mmol/L — AB (ref 22–32)
Calcium: 7.4 mg/dL — ABNORMAL LOW (ref 8.9–10.3)
Creatinine, Ser: 0.64 mg/dL (ref 0.44–1.00)
GFR calc Af Amer: >60 mL/min (ref >60)
GLUCOSE: 101 mg/dL — AB (ref 65–99)
POTASSIUM: 3.7 mmol/L (ref 3.5–5.1)
Sodium: 152 mmol/L — ABNORMAL HIGH (ref 135–145)

## 2018-01-02 LAB — SODIUM
SODIUM: 154 mmol/L — AB (ref 135–145)
SODIUM: 155 mmol/L — AB (ref 135–145)
Sodium: 150 mmol/L — ABNORMAL HIGH (ref 135–145)

## 2018-01-02 LAB — HIV ANTIBODY (ROUTINE TESTING W REFLEX): HIV Screen 4th Generation wRfx: NONREACTIVE

## 2018-01-02 LAB — CDS SEROLOGY

## 2018-01-02 MED ORDER — IOPAMIDOL (ISOVUE-300) INJECTION 61%
INTRAVENOUS | Status: AC
  Administered 2018-01-02: 15:00:00
  Filled 2018-01-02: qty 100

## 2018-01-02 MED ORDER — ARTIFICIAL TEARS OPHTHALMIC OINT
TOPICAL_OINTMENT | OPHTHALMIC | Status: DC | PRN
  Administered 2018-01-02: 18:00:00 via OPHTHALMIC
  Filled 2018-01-02: qty 3.5

## 2018-01-02 MED ORDER — IOPAMIDOL (ISOVUE-300) INJECTION 61%
INTRAVENOUS | Status: AC
  Filled 2018-01-02: qty 100

## 2018-01-02 MED ORDER — IOPAMIDOL (ISOVUE-300) INJECTION 61%
100.0000 mL | Freq: Once | INTRAVENOUS | Status: AC | PRN
  Administered 2018-01-02: 100 mL via INTRAVENOUS

## 2018-01-02 MED ORDER — MORPHINE 100MG IN NS 100ML (1MG/ML) PREMIX INFUSION
5.0000 mg/h | INTRAVENOUS | Status: DC
  Administered 2018-01-02: 5 mg/h via INTRAVENOUS
  Filled 2018-01-02: qty 100

## 2018-01-27 NOTE — Progress Notes (Signed)
Patient ID: Carin PrimroseSusan Gerrells Kane, female   DOB: 1961-08-11, 57 y.o.   MRN: [id number] No gag, no corneals, pupils fixed and dilated, no spont resps,no response to deep noxious stimuli, will make DNR, family awaiting others to arrive then will likely withdraw or CBF study

## 2018-01-27 NOTE — Progress Notes (Signed)
Came to offer support to family as patient is being released from the machines.  Had prayer and spent some time with family. Phebe CollaDonna S Enis Riecke, Chaplain   01/03/2018 1900  Clinical Encounter Type  Visited With Patient and family together  Visit Type Psychological support;Spiritual support;Critical Care  Referral From Nurse  Consult/Referral To Chaplain  Spiritual Encounters  Spiritual Needs Prayer;Emotional  Stress Factors  Patient Stress Factors None identified  Family Stress Factors Not reviewed;Other (Comment) (preparing for death)

## 2018-01-27 NOTE — Progress Notes (Signed)
Pt went asystole at 2135. Time of death called by Byanca Kasper RN & My RN. Eyes prepped. 85 cc of Morphine (Morphine 100 mg in 100 cc of NS) wasted in sink and witness by My RN. CDS, ME, and Trauma physician on call notified of pt time of death.

## 2018-01-27 NOTE — Progress Notes (Signed)
I spoke briefly with the son who just came in from [place].  [place] seems to [place] understand what is happening.  She has lost all her reflexes it seem and is no longer posturing.  Her pupils are fixed and dilated.  Very likely  Has progressed or is progressing to brain death.  Marta LamasJames O. Gae BonWyatt, III, MD, FACS [phone] Trauma Surgeon

## 2018-01-27 NOTE — Progress Notes (Signed)
Pt transported to CT and back to 4N28 on vent, no complications

## 2018-01-27 NOTE — Death Summary Note (Signed)
DEATH SUMMARY   Patient Details  Name: Ashlee Carter MRN: [id number] DOB: 22-Aug-1961  Admission/Discharge Information   Admit Date:  01/14/2018  Date of Death: Date of Death: 03/29/2018  Time of Death: Time of Death: 2135  Length of Stay: 1  Referring Physician: System, Pcp Not In   Reason(s) for Hospitalization  Severe traumatic brain injury  Diagnoses  Preliminary cause of death: severe traumatic brain injury with subdural hematoma Secondary Diagnoses (including complications and co-morbidities):  Active Problems:   Subdural hematoma [facility](HCC)   Brief Hospital Course (including significant findings, care, treatment, and services provided and events leading to death)  Ashlee Carter is a 57 y.o. year old female who was found out in her yard by a neighbor. She was unresponsive and wearing only her underwear. EMS was called and on their arrival, she remained unresponsive. She was intubated in the field and transported to the ED. She was not a trauma alert. She was worked up in the ED and found to have electrolyte abnormalities which are being treated. CT head showed an extensive L SDH with uncal herniation. We are asked to admit. The neighbor reported she has a history of ETOH abuse.  Neuroseurgery determined this was a lethal injury and there was no surgical option to improve her outcome. She was supported on the ventilator. Her exam progressed to be consistent with brain death. Her family wanted to proceed with terminal extubation at that time. Comfort care was instituted and she expired at 2135. ME referral.      Pertinent Labs and Studies  Significant Diagnostic Studies Ct Head Wo Contrast  Result Date: 01/15/2018 CLINICAL DATA:  Unwitnessed fall EXAM: CT HEAD WITHOUT CONTRAST CT CERVICAL SPINE WITHOUT CONTRAST TECHNIQUE: Multidetector CT imaging of the head and cervical spine was performed following the standard protocol without intravenous contrast. Multiplanar CT image  reconstructions of the cervical spine were also generated. COMPARISON:  None. FINDINGS: CT HEAD FINDINGS Brain: There is a left-sided subdural hematoma extending throughout the frontal, temporal, and parietal regions as well as into the left tentorium. The maximum thickness of this subdural hematoma on the left is 1.0 cm. There is a minimal right-sided subdural hematoma in the left frontal region with a maximum thickness of 3 mm. There is subarachnoid hemorrhage in both frontal lobes with moderate surrounding edema. There is subdural hematoma extending along the falx. There is midline shift of the lateral ventricles toward the right by a distance of 8 mm. Note that there is effacement of the third and fourth ventricles. There is a focal intraparenchymal hemorrhage in the posterior right cerebellum with surrounding edema. Note that there is effacement of the temporal horn of the left lateral ventricle with edema in much of the left temporal lobe. There is subarachnoid hemorrhage in the suprasellar cistern as well. There is a minimal epidural hematoma at the pons-cerebellum junction on the right near a temporal bone fracture. There is no significant mass effect from this small epidural hematoma. Vascular: No hyperdense vessels are evident. No vascular calcification evident. Skull: There is a linear fracture through the right occipital bone extending to the right parieto-occipital junction. No other fracture. No displaced fracture evident. There is scalp hematoma throughout the right temporal, right parietal, and portions of right occipital regions. There is also a fracture of the right temporal bone at the level of the middle ear with fracture extending into the middle ear. There is debris in the right external auditory canal. Sinuses/Orbits: There is opacification  with air-fluid levels in both sphenoid sinuses. There is opacification of several ethmoid air cells. Orbits appear symmetric bilaterally. Other: There is  opacification in several mastoid air cells bilaterally. CT CERVICAL SPINE FINDINGS Alignment: There is no spondylolisthesis. Skull base and vertebrae: Skull base and craniocervical junction regions appear normal. No cervical spine fracture seen. There is an occipital bone fracture on the right described under the head CT report. No blastic or lytic bone lesions. Soft tissues and spinal canal: Prevertebral soft tissues and predental space regions are normal. No paraspinous lesions. No cord or canal hematoma evident. Disc levels: No appreciable disc space narrowing. No disc extrusion or stenosis. Mild facet hypertrophy at multiple levels. Upper chest: Visualized upper lung zones are clear. Other: Patient intubated. IMPRESSION: CT head: 1. Extensive subdural hematoma throughout the left supratentorial region extending into the left tentorium causing mass effect and midline shift at the level of the lateral ventricles of 8 mm. There is falcine subdural hematoma as well as a small right-sided subdural hematoma, not causing appreciable mass effect. 2. There is uncal herniation with effacement of the third and fourth ventricles. 3. Small epidural hematoma at the level of a temporal bone fracture at the right pons-cerebellar junction, not causing significant mass effect. 4. Areas of subarachnoid hemorrhage in both frontal lobes as well as in the region of the suprasellar cistern. 5. Right temporal bone fracture at the level of the right middle ear. Occipital bone fracture with extensive soft tissue swelling/scalp hematoma throughout the right temporal, parietal, occipital regions. 6. Multifocal paranasal sinus and mastoid opacification, likely hemorrhage in this circumstance. CT cervical spine: No cervical spine fracture or spondylolisthesis. No appreciable disc space narrowing. No disc extrusion or stenosis. Critical Value/emergent results were called by telephone at the time of interpretation on Jan 24, 2018 at 9:13 am to Dr.  Eber Hong , who verbally acknowledged these results. Electronically Signed   By: Bretta Bang III M.D.   On: January 24, 2018 09:22   Ct Chest W Contrast  Addendum Date: 2018/01/25   ADDENDUM REPORT: 2018/01/25 16:45 ADDENDUM: A very trace amount of fluid is identified adjacent to the inferior liver (image 69/series 3), indeterminate. No other free fluid is identified in the abdomen or pelvis. Electronically Signed   By: Kennith Center M.D.   On: 01/25/18 16:45   Result Date: January 25, 2018 CLINICAL DATA:  Brain death.  Potential organ donor. EXAM: CT CHEST, ABDOMEN, AND PELVIS WITH CONTRAST TECHNIQUE: Multidetector CT imaging of the chest, abdomen and pelvis was performed following the standard protocol during bolus administration of intravenous contrast. CONTRAST:  ISOVUE-300 IOPAMIDOL (ISOVUE-300) INJECTION 61%, <See Chart> ISOVUE-300 IOPAMIDOL (ISOVUE-300) INJECTION 61% COMPARISON:  None. FINDINGS: CT CHEST FINDINGS Cardiovascular: The heart size is normal. No pericardial effusion. No thoracic aortic aneurysm. Aberrant origin right subclavian artery noted. Endotracheal tube identified in the trachea. NG tube is visualized in situ with the tip in the distal stomach. Right PICC line tip is positioned in the distal SVC. Mediastinum/Nodes: 8 mm short axis subcarinal lymph node is upper normal for size. No other mediastinal lymphadenopathy evident. There is no hilar lymphadenopathy. The esophagus has normal imaging features. There is no axillary lymphadenopathy. Lungs/Pleura: Biapical pleuroparenchymal scarring is evident. Calcified granuloma noted right upper lobe. 2 mm right middle lobe pulmonary nodule visible on image 78/series 4). Airway impaction in the right lower lobe with posterior right lower lobe collapse/consolidation evident. The airspace disease in the posterior right lower lobe has a masslike quality on axial image 68/series 4  measuring 3.0 x 3.0 cm, but appears less masslike on sagittal and  coronal imaging. Collapse/consolidation noted posteriorly in the left lower lobe. Musculoskeletal: Multiple right-sided rib fractures are evident. Bone windows reveal no worrisome lytic or sclerotic osseous lesions. CT ABDOMEN PELVIS FINDINGS Hepatobiliary: No focal abnormality within the liver parenchyma. Layering sludge noted in the gallbladder. No intrahepatic or extrahepatic biliary dilation. Pancreas: No focal mass lesion. No dilatation of the main duct. No intraparenchymal cyst. No peripancreatic edema. Spleen: No splenomegaly. No focal mass lesion. Adrenals/Urinary Tract: No adrenal nodule or mass. Kidneys unremarkable. No evidence for hydroureter. Foley catheter decompresses the urinary bladder and gas in the bladder lumen is compatible with the presence of the catheter. Stomach/Bowel: NG tube decompresses the stomach. Duodenum is normally positioned as is the ligament of Treitz. No small bowel wall thickening. No small bowel dilatation. The terminal ileum is normal. The appendix is normal. No gross colonic mass. No colonic wall thickening. No substantial diverticular change. Vascular/Lymphatic: There is abdominal aortic atherosclerosis without aneurysm. There is no gastrohepatic or hepatoduodenal ligament lymphadenopathy. No intraperitoneal or retroperitoneal lymphadenopathy. No pelvic sidewall lymphadenopathy. Reproductive: The uterus has normal CT imaging appearance. There is no adnexal mass. Other: No intraperitoneal free fluid. Musculoskeletal: Small right groin hernia contains only fat. Tiny umbilical hernia is fat containing. Bone windows reveal no worrisome lytic or sclerotic osseous lesions. IMPRESSION: 1. Posterior right lower lobe airspace consolidation with associated airway impaction. Imaging features likely related to aspiration and/or pneumonia. This disease has a masslike quality along its cranial aspect on axial imaging, but does not appear as masslike on coronal and sagittal reformations.  As pulmonary vasculature can be seen to track directly through this lesion, neoplasm as etiology is considered less likely but not excluded. 2. Aberrant origin right subclavian artery. Electronically Signed: By: Kennith Center M.D. On: 2018/01/30 16:38   Ct Cervical Spine Wo Contrast  Result Date: 01/17/2018 CLINICAL DATA:  Unwitnessed fall EXAM: CT HEAD WITHOUT CONTRAST CT CERVICAL SPINE WITHOUT CONTRAST TECHNIQUE: Multidetector CT imaging of the head and cervical spine was performed following the standard protocol without intravenous contrast. Multiplanar CT image reconstructions of the cervical spine were also generated. COMPARISON:  None. FINDINGS: CT HEAD FINDINGS Brain: There is a left-sided subdural hematoma extending throughout the frontal, temporal, and parietal regions as well as into the left tentorium. The maximum thickness of this subdural hematoma on the left is 1.0 cm. There is a minimal right-sided subdural hematoma in the left frontal region with a maximum thickness of 3 mm. There is subarachnoid hemorrhage in both frontal lobes with moderate surrounding edema. There is subdural hematoma extending along the falx. There is midline shift of the lateral ventricles toward the right by a distance of 8 mm. Note that there is effacement of the third and fourth ventricles. There is a focal intraparenchymal hemorrhage in the posterior right cerebellum with surrounding edema. Note that there is effacement of the temporal horn of the left lateral ventricle with edema in much of the left temporal lobe. There is subarachnoid hemorrhage in the suprasellar cistern as well. There is a minimal epidural hematoma at the pons-cerebellum junction on the right near a temporal bone fracture. There is no significant mass effect from this small epidural hematoma. Vascular: No hyperdense vessels are evident. No vascular calcification evident. Skull: There is a linear fracture through the right occipital bone extending to  the right parieto-occipital junction. No other fracture. No displaced fracture evident. There is scalp hematoma throughout the right  temporal, right parietal, and portions of right occipital regions. There is also a fracture of the right temporal bone at the level of the middle ear with fracture extending into the middle ear. There is debris in the right external auditory canal. Sinuses/Orbits: There is opacification with air-fluid levels in both sphenoid sinuses. There is opacification of several ethmoid air cells. Orbits appear symmetric bilaterally. Other: There is opacification in several mastoid air cells bilaterally. CT CERVICAL SPINE FINDINGS Alignment: There is no spondylolisthesis. Skull base and vertebrae: Skull base and craniocervical junction regions appear normal. No cervical spine fracture seen. There is an occipital bone fracture on the right described under the head CT report. No blastic or lytic bone lesions. Soft tissues and spinal canal: Prevertebral soft tissues and predental space regions are normal. No paraspinous lesions. No cord or canal hematoma evident. Disc levels: No appreciable disc space narrowing. No disc extrusion or stenosis. Mild facet hypertrophy at multiple levels. Upper chest: Visualized upper lung zones are clear. Other: Patient intubated. IMPRESSION: CT head: 1. Extensive subdural hematoma throughout the left supratentorial region extending into the left tentorium causing mass effect and midline shift at the level of the lateral ventricles of 8 mm. There is falcine subdural hematoma as well as a small right-sided subdural hematoma, not causing appreciable mass effect. 2. There is uncal herniation with effacement of the third and fourth ventricles. 3. Small epidural hematoma at the level of a temporal bone fracture at the right pons-cerebellar junction, not causing significant mass effect. 4. Areas of subarachnoid hemorrhage in both frontal lobes as well as in the region of the  suprasellar cistern. 5. Right temporal bone fracture at the level of the right middle ear. Occipital bone fracture with extensive soft tissue swelling/scalp hematoma throughout the right temporal, parietal, occipital regions. 6. Multifocal paranasal sinus and mastoid opacification, likely hemorrhage in this circumstance. CT cervical spine: No cervical spine fracture or spondylolisthesis. No appreciable disc space narrowing. No disc extrusion or stenosis. Critical Value/emergent results were called by telephone at the time of interpretation on 01/08/2018 at 9:13 am to Dr. Eber Hong , who verbally acknowledged these results. Electronically Signed   By: Bretta Bang III M.D.   On: 12/31/2017 09:22   Ct Abdomen Pelvis W Contrast  Addendum Date: 01-11-2018   ADDENDUM REPORT: 01-11-2018 16:45 ADDENDUM: A very trace amount of fluid is identified adjacent to the inferior liver (image 69/series 3), indeterminate. No other free fluid is identified in the abdomen or pelvis. Electronically Signed   By: Kennith Center M.D.   On: 2018/01/11 16:45   Result Date: January 11, 2018 CLINICAL DATA:  Brain death.  Potential organ donor. EXAM: CT CHEST, ABDOMEN, AND PELVIS WITH CONTRAST TECHNIQUE: Multidetector CT imaging of the chest, abdomen and pelvis was performed following the standard protocol during bolus administration of intravenous contrast. CONTRAST:  ISOVUE-300 IOPAMIDOL (ISOVUE-300) INJECTION 61%, <See Chart> ISOVUE-300 IOPAMIDOL (ISOVUE-300) INJECTION 61% COMPARISON:  None. FINDINGS: CT CHEST FINDINGS Cardiovascular: The heart size is normal. No pericardial effusion. No thoracic aortic aneurysm. Aberrant origin right subclavian artery noted. Endotracheal tube identified in the trachea. NG tube is visualized in situ with the tip in the distal stomach. Right PICC line tip is positioned in the distal SVC. Mediastinum/Nodes: 8 mm short axis subcarinal lymph node is upper normal for size. No other mediastinal  lymphadenopathy evident. There is no hilar lymphadenopathy. The esophagus has normal imaging features. There is no axillary lymphadenopathy. Lungs/Pleura: Biapical pleuroparenchymal scarring is evident. Calcified granuloma noted  right upper lobe. 2 mm right middle lobe pulmonary nodule visible on image 78/series 4). Airway impaction in the right lower lobe with posterior right lower lobe collapse/consolidation evident. The airspace disease in the posterior right lower lobe has a masslike quality on axial image 68/series 4 measuring 3.0 x 3.0 cm, but appears less masslike on sagittal and coronal imaging. Collapse/consolidation noted posteriorly in the left lower lobe. Musculoskeletal: Multiple right-sided rib fractures are evident. Bone windows reveal no worrisome lytic or sclerotic osseous lesions. CT ABDOMEN PELVIS FINDINGS Hepatobiliary: No focal abnormality within the liver parenchyma. Layering sludge noted in the gallbladder. No intrahepatic or extrahepatic biliary dilation. Pancreas: No focal mass lesion. No dilatation of the main duct. No intraparenchymal cyst. No peripancreatic edema. Spleen: No splenomegaly. No focal mass lesion. Adrenals/Urinary Tract: No adrenal nodule or mass. Kidneys unremarkable. No evidence for hydroureter. Foley catheter decompresses the urinary bladder and gas in the bladder lumen is compatible with the presence of the catheter. Stomach/Bowel: NG tube decompresses the stomach. Duodenum is normally positioned as is the ligament of Treitz. No small bowel wall thickening. No small bowel dilatation. The terminal ileum is normal. The appendix is normal. No gross colonic mass. No colonic wall thickening. No substantial diverticular change. Vascular/Lymphatic: There is abdominal aortic atherosclerosis without aneurysm. There is no gastrohepatic or hepatoduodenal ligament lymphadenopathy. No intraperitoneal or retroperitoneal lymphadenopathy. No pelvic sidewall lymphadenopathy. Reproductive:  The uterus has normal CT imaging appearance. There is no adnexal mass. Other: No intraperitoneal free fluid. Musculoskeletal: Small right groin hernia contains only fat. Tiny umbilical hernia is fat containing. Bone windows reveal no worrisome lytic or sclerotic osseous lesions. IMPRESSION: 1. Posterior right lower lobe airspace consolidation with associated airway impaction. Imaging features likely related to aspiration and/or pneumonia. This disease has a masslike quality along its cranial aspect on axial imaging, but does not appear as masslike on coronal and sagittal reformations. As pulmonary vasculature can be seen to track directly through this lesion, neoplasm as etiology is considered less likely but not excluded. 2. Aberrant origin right subclavian artery. Electronically Signed: By: Kennith Center M.D. On: 01/23/2018 16:38   Dg Chest Portable 1 View  Result Date: Jan 29, 2018 CLINICAL DATA:  Check endotracheal tube placement EXAM: PORTABLE CHEST 1 VIEW COMPARISON:  29-Jan-2018 FINDINGS: Cardiac shadow is stable. Stable right perihilar mass lesion is noted. The lungs are otherwise clear bilaterally. Endotracheal tube is noted in satisfactory position. Nasogastric catheter is noted within the stomach. No bony abnormality is noted. IMPRESSION: Endotracheal tube and nasogastric catheter in satisfactory position. Right perihilar mass lesion is again seen and stable. Electronically Signed   By: Alcide Clever M.D.   On: Jan 29, 2018 10:20   Dg Chest Portable 1 View  Result Date: 2018-01-29 CLINICAL DATA:  Intubation.  Found unresponsive in yard. EXAM: PORTABLE CHEST 1 VIEW COMPARISON:  None. FINDINGS: Right mainstem intubation. 3 cm retraction would place the tip between the clavicular heads and carina. Masslike opacity over the right hilum measuring 3.3 cm. Symmetric lung aeration. No visible contusion, hemothorax, or pneumothorax. No visible fracture. Symmetric aeration. These results were called by telephone at  the time of interpretation on 01/29/2018 at 9:09 am to Dr. Eber Hong , who verbally acknowledged these results. Dr. Hyacinth Meeker is already aware of the contemporaneous head CT findings positive for contusion, subdural, and subarachnoid blood with skull fracture and midline shift causing left uncal herniation. IMPRESSION: 1. Right mainstem intubation.  Suggest tube retraction by 3 cm. 2. Masslike opacity over the right hilum  primarily concerning for malignancy. Recommend chest CT when clinically appropriate. Electronically Signed   By: Marnee Spring M.D.   On: 01/19/2018 09:11   [place] Ekg Site Rite  Result Date: 12/31/2017 If Site Rite image not attached, placement could not be confirmed due to current cardiac rhythm.  [place] Ekg Site Rite  Result Date: 12/28/2017 If Site Rite image not attached, placement could not be confirmed due to current cardiac rhythm.   Microbiology Recent Results (from the past 240 hour(s))  MRSA PCR Screening     Status: None   Collection Time: 01/09/2018 10:32 AM  Result Value Ref Range Status   MRSA by PCR NEGATIVE NEGATIVE Final    Comment:        The GeneXpert MRSA Assay (FDA approved for NASAL specimens only), is one component of a comprehensive MRSA colonization surveillance program. It is not intended to diagnose MRSA infection nor to guide or monitor treatment for MRSA infections. Performed at [facility], [address], [place], [place] [address]     Lab Basic Metabolic Panel: Recent Labs  Lab 01/20/2018 [id number] 01/06/2018 0842  01/05/2018 1714 01/10/2018 2311 2018/01/14 0530 01/14/2018 1215 01-14-2018 1750  NA 138 142   < > 146*  146* 150* 152* 154* 155*  K 2.7* 2.7*  --  3.6  --  3.7  --   --   CL 110 112*  --  111  --  124*  --   --   CO2 11*  --   --  20*  --  20*  --   --   GLUCOSE 266* 280*  --  114*  --  101*  --   --   BUN 11 10  --  6  --  5*  --   --   CREATININE 0.58 0.60  --  0.72  --  0.64  --   --   CALCIUM 8.8*  --   --  7.8*  --  7.4*  --    --   MG  --   --   --  1.9  --   --   --   --    < > = values in this interval not displayed.   Liver Function Tests: Recent Labs  Lab 01/21/2018 0836  AST 46*  ALT 27  ALKPHOS 94  BILITOT 1.1  PROT 6.7  ALBUMIN 4.1   No results for input(s): LIPASE, AMYLASE in the last 168 hours. No results for input(s): AMMONIA in the last 168 hours. CBC: Recent Labs  Lab 01/12/2018 0836 01/12/2018 0842 2018/01/14 0530  WBC 22.9*  --  10.8*  HGB 14.6 15.0 8.8*  HCT 43.2 44.0 26.2*  MCV 97.3  --  97.4  PLT 188  --  130*   Cardiac Enzymes: No results for input(s): CKTOTAL, CKMB, CKMBINDEX, TROPONINI in the last 168 hours. Sepsis Labs: Recent Labs  Lab 01/11/2018 0836 01/15/2018 0843 2018-01-14 0530  WBC 22.9*  --  10.8*  LATICACIDVEN  --  4.43*  --     Procedures/Operations  none   Liz Malady 01/04/2018, 2:54 PM

## 2018-01-27 NOTE — Procedures (Signed)
Extubation Procedure Note  Patient Details:   Name: Ashlee PrimroseSusan Gerrells Carter DOB: 12/10/1960 MRN: [id number]   Airway Documentation:  Airway 7 mm (Active)  Secured at (cm) 23 cm 01/05/2018  7:39 PM  Measured From Lips 01/18/2018  7:39 PM  Secured Location Right 01/11/2018  7:39 PM  Secured By [employer] T[employer] Holder 01/23/2018  7:39 PM  Tube Holder Repositioned Yes 01/16/2018  7:39 PM  Cuff Pressure (cm H2O) 25 cm H2O 12/29/2017  7:39 PM  Site Condition Dry 01/03/2018  7:39 PM    Evaluation  O2 sats: currently acceptable Complications: No apparent complications Patient did tolerate procedure well. Bilateral Breath Sounds: Clear, Diminished   No  [name] 01/09/2018, 9:12 PM

## 2018-01-27 NOTE — Progress Notes (Signed)
Patient ID: Ashlee Carter, female   DOB: [birth year], 57 y.o.   MRN: [id number] Follow up - Trauma Critical Care  Patient Details:    Ashlee Carter is an 57 y.o. female.  Lines/tubes : Airway 7 mm (Active)  Secured at (cm) 23 cm 01/16/2018  8:38 AM  Measured From Lips 01/15/2018  8:38 AM  Secured Location Center 01/26/2018  8:38 AM  Secured By [employer] 01/06/2018  8:38 AM  Tube Holder Repositioned Yes 12/29/2017  8:38 AM  Cuff Pressure (cm H2O) 26 cm H2O 01-16-18  9:45 PM  Site Condition Dry 01/01/2018  8:38 AM     PICC Triple Lumen 01-16-18 PICC Right Basilic 38 cm 0 cm (Active)  Indication for Insertion or Continuance of Line Vasoactive infusions;Poor Vasculature-patient has had multiple peripheral attempts or PIVs lasting less than 24 hours 01/17/2018  8:00 AM  Exposed Catheter (cm) 0 cm 2018/01/16 10:26 PM  Site Assessment Clean;Dry;Intact 01/20/2018  8:00 AM  Lumen #1 Status Infusing 01/18/2018  8:00 AM  Lumen #2 Status Infusing 12/30/2017  8:00 AM  Lumen #3 Status In-line blood sampling system in place 01/12/2018  8:00 AM  Dressing Type Transparent 01/17/2018  8:00 AM  Dressing Status Clean;Dry;Intact;Antimicrobial disc in place 01/23/2018  8:00 AM  Dressing Change Due 01/08/18 01/17/2018  8:00 AM     NG/OG Tube Orogastric 16 Fr. Left mouth Xray;Aucultation Measured external length of tube 15 cm (Active)  External Length of Tube (cm) - (if applicable) 15 cm 16-Jan-2018  9:42 AM  Site Assessment Clean;Dry;Intact 01/13/2018  8:00 AM  Ongoing Placement Verification No change in cm markings or external length of tube from initial placement 12/28/2017  8:00 AM  Status Suction-low intermittent 01/16/2018  8:00 AM  Drainage Appearance Bile;Bloody 01/12/2018  8:00 AM     Urethral Catheter Flossie Dibble 14 Fr. (Active)  Indication for Insertion or Continuance of Catheter Unstable critical patients (first 24-48 hours) 01/17/2018  8:00 AM  Site Assessment Clean;Intact;Dry 01/18/2018  8:00 AM   Catheter Maintenance Bag below level of bladder;Catheter secured;Drainage bag/tubing not touching floor;Insertion date on drainage bag 01/13/2018  8:00 AM  Collection Container Standard drainage bag 01/21/2018  8:00 AM  Securement Method Securing device (Describe) 01/01/2018  8:00 AM  Input (mL) 440 mL 01-16-2018  7:00 PM  Output (mL) 125 mL 12/28/2017  9:00 AM    Microbiology/Sepsis markers: Results for orders placed or performed during the hospital encounter of 01-16-18  MRSA PCR Screening     Status: None   Collection Time: January 16, 2018 10:32 AM  Result Value Ref Range Status   MRSA by PCR NEGATIVE NEGATIVE Final    Comment:        The GeneXpert MRSA Assay (FDA approved for NASAL specimens only), is one component of a comprehensive MRSA colonization surveillance program. It is not intended to diagnose MRSA infection nor to guide or monitor treatment for MRSA infections. Performed at [facility], [address], [place], [place] [address]     Anti-infectives:  Anti-infectives (From admission, onward)   None      Best Practice/Protocols:  VTE Prophylaxis: Mechanical GI Prophylaxis: Proton Pump Inhibitor Continous Sedation  Consults:     Studies:    Events:  Subjective:    Overnight Issues:   Objective:  Vital signs for last 24 hours: Temp:  [85.1 F (29.5 C)-100.8 F (38.2 C)] 97.2 F (36.2 C) (04/06 1030) Pulse Rate:  [48-164] 79 (04/06 1030) Resp:  [14-24] 20 (04/06  1030) BP: (52-177)/(42-130) 99/64 (04/06 1030) SpO2:  [97 %-100 %] 100 % (04/06 1030) FiO2 (%):  [40 %] 40 % (04/06 0838) Weight:  [64.5 kg (142 lb 3.2 oz)] 64.5 kg (142 lb 3.2 oz) (04/06 0615)  Hemodynamic parameters for last 24 hours:    Intake/Output from previous day: 04/05 0701 - 04/06 0700 In: 4506.1 [I.V.:2566.1; IV Piggyback:1500] Out: 3980 [Urine:3980]  Intake/Output this shift: Total I/O In: 500 [I.V.:500] Out: 165 [Urine:165]  Vent settings for last 24 hours: Vent  Mode: PRVC FiO2 (%):  [40 %] 40 % Set Rate:  [20 bmp] 20 bmp Vt Set:  [470 mL] 470 mL PEEP:  [5 cmH20] 5 cmH20 Plateau Pressure:  [14 cmH20-18 cmH20] 16 cmH20  Physical Exam:  General: unresponsive Neuro: intubated. Fixed and dilated. No response to stimuli HEENT/Neck: tracheal not deviated.  Resp: clear to auscultation bilaterally CVS: regular rate and rhythm, S1, S2 normal, no murmur, click, rub or gallop GI: soft, nontender, BS WNL, no r/g Skin: no rash Extremities: no edema, no erythema, pulses WNL  Results for orders placed or performed during the hospital encounter of 01-23-2018 (from the past 24 hour(s))  Sodium     Status: None   Collection Time: 2018/01/23 11:11 AM  Result Value Ref Range   Sodium 143 135 - 145 mmol/L  Triglycerides     Status: None   Collection Time: 01/23/18 11:11 AM  Result Value Ref Range   Triglycerides 89 <150 mg/dL  HIV antibody (Routine Testing)     Status: None   Collection Time: 01-23-2018 11:29 AM  Result Value Ref Range   HIV Screen 4th Generation wRfx Non Reactive Non Reactive  Sodium     Status: Abnormal   Collection Time: 2018/01/23  5:14 PM  Result Value Ref Range   Sodium 146 (H) 135 - 145 mmol/L  Basic metabolic panel     Status: Abnormal   Collection Time: 23-Jan-2018  5:14 PM  Result Value Ref Range   Sodium 146 (H) 135 - 145 mmol/L   Potassium 3.6 3.5 - 5.1 mmol/L   Chloride 111 101 - 111 mmol/L   CO2 20 (L) 22 - 32 mmol/L   Glucose, Bld 114 (H) 65 - 99 mg/dL   BUN 6 6 - 20 mg/dL   Creatinine, Ser 1.61 0.44 - 1.00 mg/dL   Calcium 7.8 (L) 8.9 - 10.3 mg/dL   GFR calc non Af Amer >60 >60 mL/min   GFR calc Af Amer >60 >60 mL/min   Anion gap 15 5 - 15  Magnesium     Status: None   Collection Time: 2018/01/23  5:14 PM  Result Value Ref Range   Magnesium 1.9 1.7 - 2.4 mg/dL  Sodium     Status: Abnormal   Collection Time: 01-23-18 11:11 PM  Result Value Ref Range   Sodium 150 (H) 135 - 145 mmol/L  I-STAT 3, arterial blood gas (G3+)      Status: Abnormal   Collection Time: 01/16/2018  3:35 AM  Result Value Ref Range   pH, Arterial 7.388 7.350 - 7.450   pCO2 arterial 33.7 32.0 - 48.0 mmHg   pO2, Arterial 176.0 (H) 83.0 - 108.0 mmHg   Bicarbonate 20.4 20.0 - 28.0 mmol/L   TCO2 21 (L) 22 - 32 mmol/L   O2 Saturation 100.0 %   Acid-base deficit 4.0 (H) 0.0 - 2.0 mmol/L   Patient temperature 98.0 F    Collection site RADIAL, ALLEN'S TEST ACCEPTABLE  Drawn by Operator    Sample type ARTERIAL   CBC     Status: Abnormal   Collection Time: 02-07-18  5:30 AM  Result Value Ref Range   WBC 10.8 (H) 4.0 - 10.5 K/uL   RBC 2.69 (L) 3.87 - 5.11 MIL/uL   Hemoglobin 8.8 (L) 12.0 - 15.0 g/dL   HCT 40.926.2 (L) 81.136.0 - 91.446.0 %   MCV 97.4 78.0 - 100.0 fL   MCH 32.7 26.0 - 34.0 pg   MCHC 33.6 30.0 - 36.0 g/dL   RDW 78.213.8 95.611.5 - 21.315.5 %   Platelets 130 (L) 150 - 400 K/uL  Basic metabolic panel     Status: Abnormal   Collection Time: 02-07-18  5:30 AM  Result Value Ref Range   Sodium 152 (H) 135 - 145 mmol/L   Potassium 3.7 3.5 - 5.1 mmol/L   Chloride 124 (H) 101 - 111 mmol/L   CO2 20 (L) 22 - 32 mmol/L   Glucose, Bld 101 (H) 65 - 99 mg/dL   BUN 5 (L) 6 - 20 mg/dL   Creatinine, Ser 0.860.64 0.44 - 1.00 mg/dL   Calcium 7.4 (L) 8.9 - 10.3 mg/dL   GFR calc non Af Amer >60 >60 mL/min   GFR calc Af Amer >60 >60 mL/min   Anion gap 8 5 - 15    Assessment & Plan: Present on Admission: . 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 Yetta BarreJones consulting; lethal injury per nsg.  Hypothermia - resolved.  Hypokalemia - replaced Anemia - probably chronic. Monitor hgb Shock - vasopressors as needed until family makes decision.  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   Dispo - awaiting addl family members to determine whether or not they want  to withdraw or proceed with brain flow study.    LOS: 1 day   Additional comments:I reviewed the patient's new clinical lab test results. and I have discussed and reviewed with family members patient's present  Critical Care Total Time*: 15 Minutes  Mary Sellaric M. Andrey CampanileWilson, MD, FACS General, Bariatric, & Minimally Invasive Surgery [facility] [place]   01/21/2018  *Care during the described time interval was provided by me. I have reviewed this patient's available data, including medical history, events of note, physical examination and test results as part of my evaluation.

## 2018-01-27 DEATH — deceased

## 2018-08-22 IMAGING — CT CT ABD-PELV W/ CM
2 of 5 series · 12 of 36 positions shown, 15 images · IV contrast (APPLIED)
Comparison: None.

ADDENDUM:
A very trace amount of fluid is identified adjacent to the inferior
liver (image 69/series 3), indeterminate. No other free fluid is
identified in the abdomen or pelvis.
CLINICAL DATA: Brain death.  Potential organ donor.

EXAM:
CT CHEST, ABDOMEN, AND PELVIS WITH CONTRAST
TECHNIQUE: Multidetector CT imaging of the chest, abdomen and pelvis was
performed following the standard protocol during bolus
administration of intravenous contrast.
CONTRAST:  100mL GMTDTH-O33 IOPAMIDOL (GMTDTH-O33) INJECTION 61%,
<See Chart> GMTDTH-O33 IOPAMIDOL (GMTDTH-O33) INJECTION 61%

[Series 3: cap 5.0 i31f 2 · axial · 0.84mm/px · z∈[-566,-66]mm · 9 of 124 slices shown, 12 images]
[im 12/124  mediastinal]
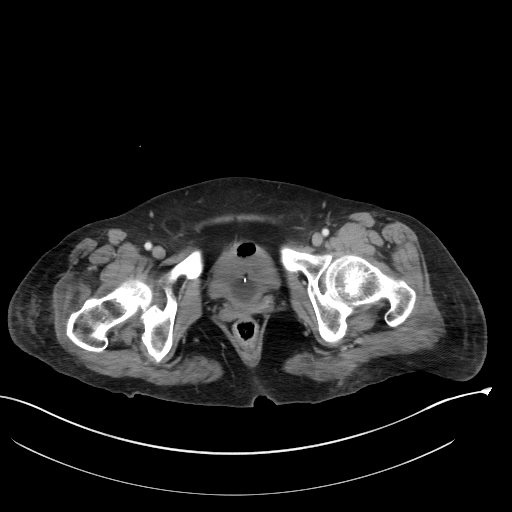
[im 12/124  lung]
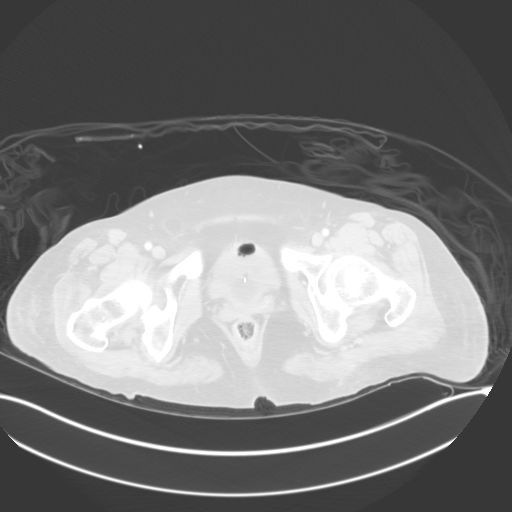
[im 23/124  lung]
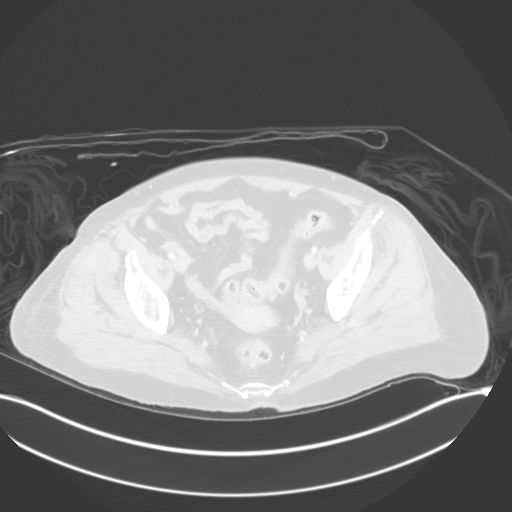
[im 34/124  lung]
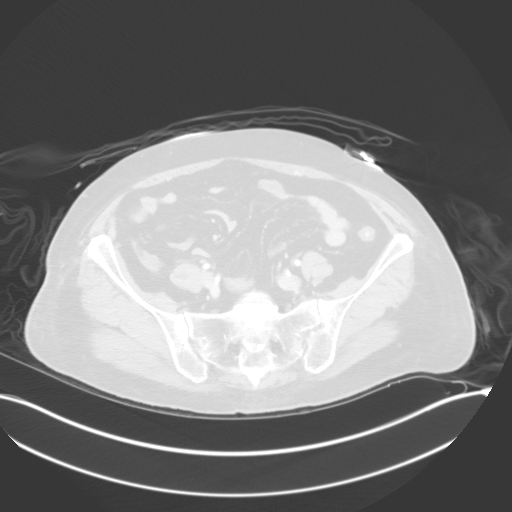
[im 45/124  lung]
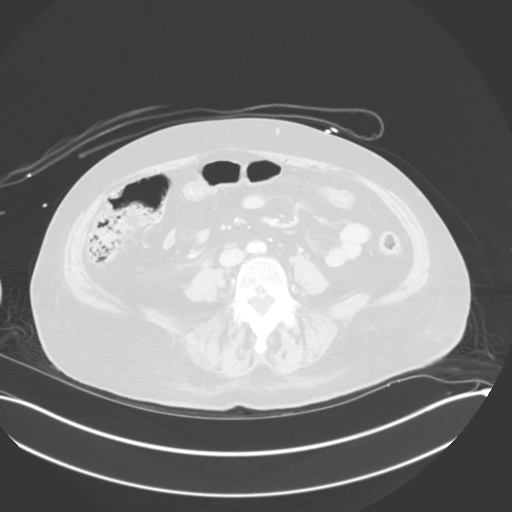
[im 68/124  mediastinal]
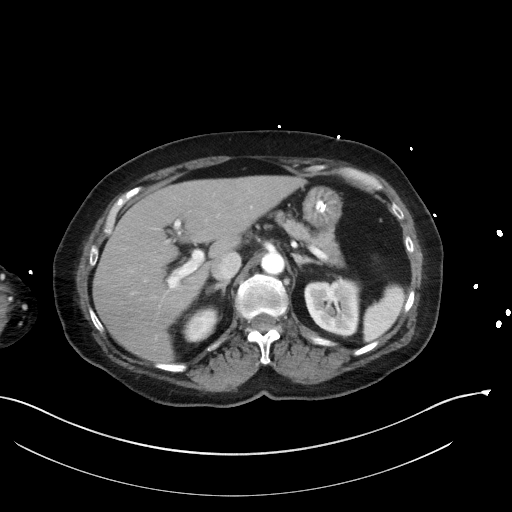
[im 68/124  lung]
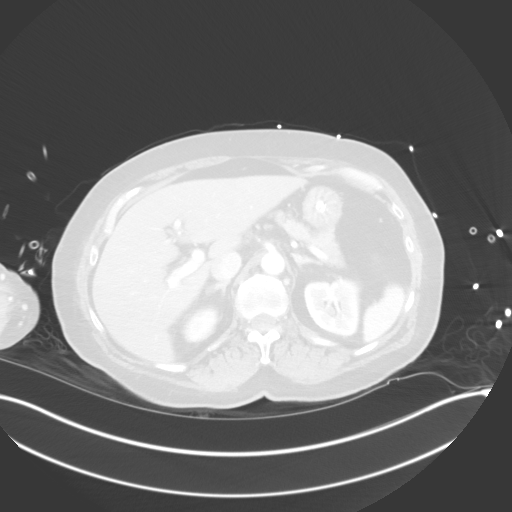
[im 79/124  lung]
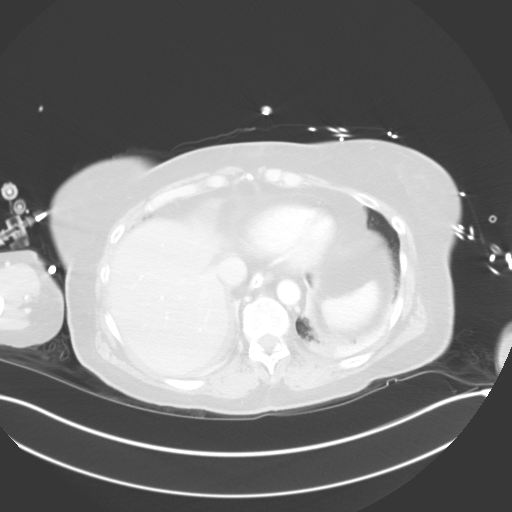
[im 90/124  lung]
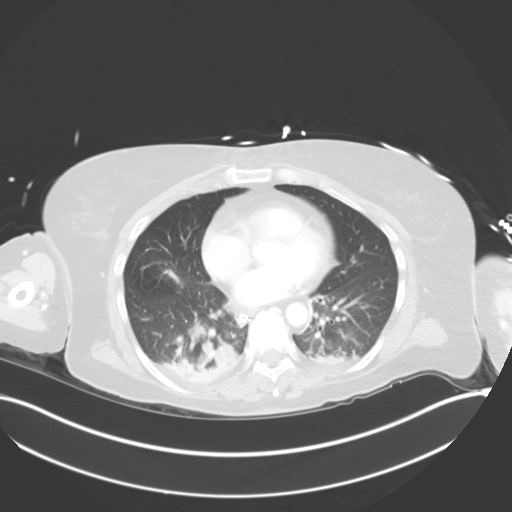
[im 101/124  lung]
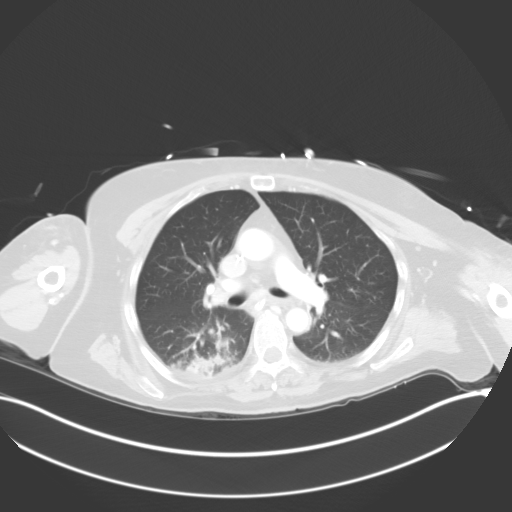
[im 112/124  mediastinal]
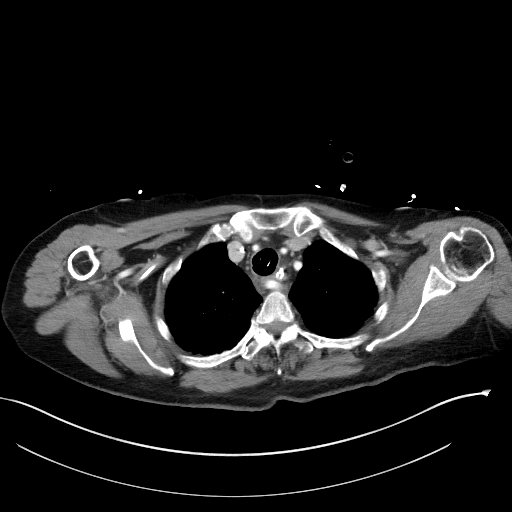
[im 112/124  lung]
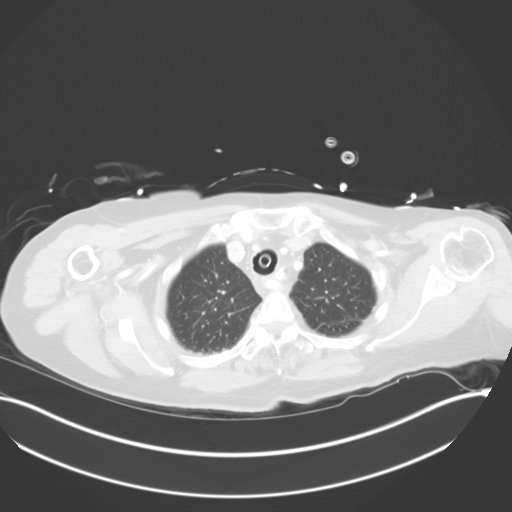

[Series 6: coronal · coronal · 0.77mm/px · 3 of 150 slices shown]
[im 30/150  lung]
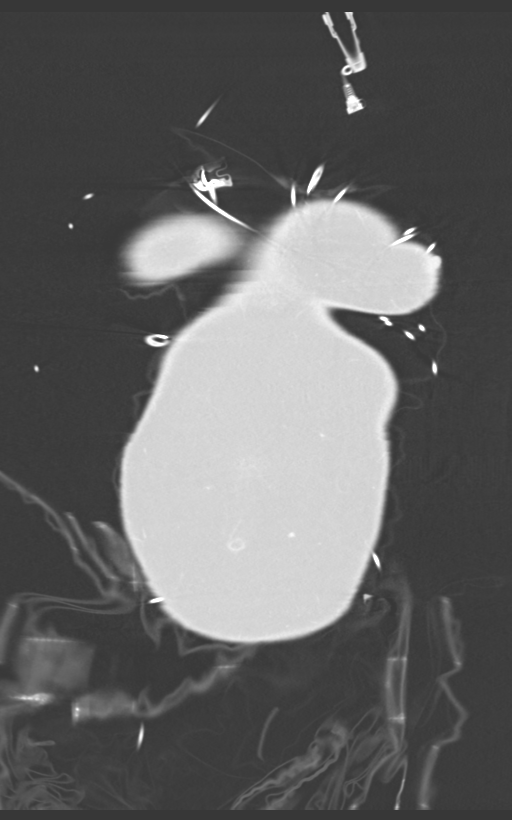
[im 60/150  lung]
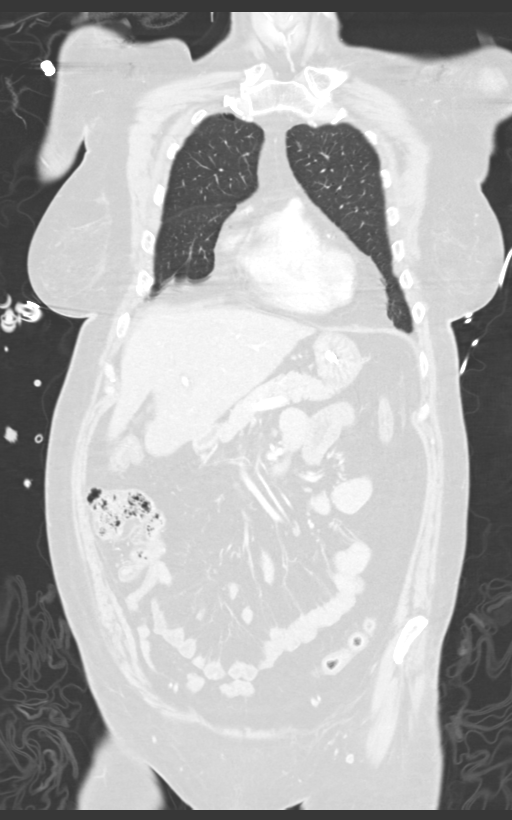
[im 90/150  lung]
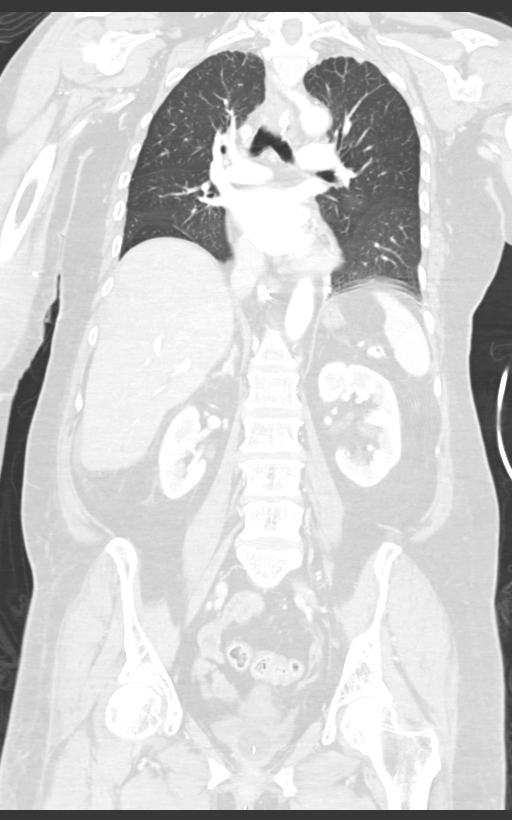

[12 of 36 positions shown; findings below may reference images not displayed]

FINDINGS: CT CHEST FINDINGS

Cardiovascular: The heart size is normal. No pericardial effusion.
No thoracic aortic aneurysm. Aberrant origin right subclavian artery
noted. Endotracheal tube identified in the trachea. NG tube is
visualized in situ with the tip in the distal stomach. Right PICC
line tip is positioned in the distal SVC.

Mediastinum/Nodes: 8 mm short axis subcarinal lymph node is upper
normal for size. No other mediastinal lymphadenopathy evident. There
is no hilar lymphadenopathy. The esophagus has normal imaging
features. There is no axillary lymphadenopathy.

Lungs/Pleura: Biapical pleuroparenchymal scarring is evident.
Calcified granuloma noted right upper lobe. 2 mm right middle lobe
pulmonary nodule visible on image 78/series 4). Airway impaction in
the right lower lobe with posterior right lower lobe
collapse/consolidation evident. The airspace disease in the
posterior right lower lobe has a masslike quality on axial image
68/series 4 measuring 3.0 x 3.0 cm, but appears less masslike on
sagittal and coronal imaging.. Collapse/consolidation noted
posteriorly in the left lower lobe.

Musculoskeletal: Multiple right-sided rib fractures are evident.
Bone windows reveal no worrisome lytic or sclerotic osseous lesions.

CT ABDOMEN PELVIS FINDINGS

Hepatobiliary: No focal abnormality within the liver parenchyma.
Layering sludge noted in the gallbladder. No intrahepatic or
extrahepatic biliary dilation.

Pancreas: No focal mass lesion. No dilatation of the main duct. No
intraparenchymal cyst. No peripancreatic edema.

Spleen: No splenomegaly. No focal mass lesion.

Adrenals/Urinary Tract: No adrenal nodule or mass. Kidneys
unremarkable. No evidence for hydroureter. Foley catheter
decompresses the urinary bladder and gas in the bladder lumen is
compatible with the presence of the catheter.

Stomach/Bowel: NG tube decompresses the stomach. Duodenum is
normally positioned as is the ligament of Treitz. No small bowel
wall thickening. No small bowel dilatation. The terminal ileum is
normal. The appendix is normal. No gross colonic mass. No colonic
wall thickening. No substantial diverticular change.

Vascular/Lymphatic: There is abdominal aortic atherosclerosis
without aneurysm. There is no gastrohepatic or hepatoduodenal
ligament lymphadenopathy. No intraperitoneal or retroperitoneal
lymphadenopathy. No pelvic sidewall lymphadenopathy.

Reproductive: The uterus has normal CT imaging appearance. There is
no adnexal mass.

Other: No intraperitoneal free fluid.

Musculoskeletal: Small right groin hernia contains only fat. Tiny
umbilical hernia is fat containing. Bone windows reveal no worrisome
lytic or sclerotic osseous lesions.
IMPRESSION: 1. Posterior right lower lobe airspace consolidation with associated
airway impaction. Imaging features likely related to aspiration
and/or pneumonia. This disease has a masslike quality along its
cranial aspect on axial imaging, but does not appear as masslike on
coronal and sagittal reformations. As pulmonary vasculature can be
seen to track directly through this lesion, neoplasm as etiology is
considered less likely but not excluded.
2. Aberrant origin right subclavian artery.
